# Patient Record
Sex: Female | Born: 1990 | Race: White | Hispanic: No | Marital: Married | Smoking: Former smoker
Health system: Southern US, Community
[De-identification: ages and names within clinical notes are randomized; demographics above are authoritative.]

## PROBLEM LIST (undated history)

## (undated) ENCOUNTER — Inpatient Hospital Stay: Payer: Self-pay

## (undated) DIAGNOSIS — M549 Dorsalgia, unspecified: Secondary | ICD-10-CM

## (undated) DIAGNOSIS — R079 Chest pain, unspecified: Secondary | ICD-10-CM

## (undated) DIAGNOSIS — F32A Depression, unspecified: Secondary | ICD-10-CM

## (undated) DIAGNOSIS — F419 Anxiety disorder, unspecified: Secondary | ICD-10-CM

## (undated) DIAGNOSIS — F329 Major depressive disorder, single episode, unspecified: Secondary | ICD-10-CM

## (undated) DIAGNOSIS — A64 Unspecified sexually transmitted disease: Secondary | ICD-10-CM

## (undated) DIAGNOSIS — Z9289 Personal history of other medical treatment: Secondary | ICD-10-CM

## (undated) DIAGNOSIS — K5900 Constipation, unspecified: Secondary | ICD-10-CM

## (undated) DIAGNOSIS — R0602 Shortness of breath: Secondary | ICD-10-CM

## (undated) HISTORY — DX: Dorsalgia, unspecified: M54.9

## (undated) HISTORY — DX: Depression, unspecified: F32.A

## (undated) HISTORY — DX: Major depressive disorder, single episode, unspecified: F32.9

## (undated) HISTORY — DX: Anxiety disorder, unspecified: F41.9

## (undated) HISTORY — DX: Unspecified sexually transmitted disease: A64

## (undated) HISTORY — DX: Personal history of other medical treatment: Z92.89

## (undated) HISTORY — DX: Constipation, unspecified: K59.00

## (undated) HISTORY — DX: Chest pain, unspecified: R07.9

## (undated) HISTORY — DX: Shortness of breath: R06.02

---
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
## 2013-11-05 HISTORY — PX: OTHER SURGICAL HISTORY: SHX169

## 2013-12-03 ENCOUNTER — Emergency Department: Payer: Self-pay | Admitting: Emergency Medicine

## 2013-12-03 LAB — CBC WITH DIFFERENTIAL/PLATELET
BASOS ABS: 0 10*3/uL (ref 0.0–0.1)
Basophil %: 0.2 %
Eosinophil #: 0.1 10*3/uL (ref 0.0–0.7)
Eosinophil %: 0.9 %
HCT: 44.5 % (ref 35.0–47.0)
HGB: 15 g/dL (ref 12.0–16.0)
Lymphocyte #: 0.7 10*3/uL — ABNORMAL LOW (ref 1.0–3.6)
Lymphocyte %: 7.8 %
MCH: 31.3 pg (ref 26.0–34.0)
MCHC: 33.8 g/dL (ref 32.0–36.0)
MCV: 93 fL (ref 80–100)
MONO ABS: 0.4 x10 3/mm (ref 0.2–0.9)
MONOS PCT: 4 %
Neutrophil #: 8 10*3/uL — ABNORMAL HIGH (ref 1.4–6.5)
Neutrophil %: 87.1 %
PLATELETS: 183 10*3/uL (ref 150–440)
RBC: 4.81 10*6/uL (ref 3.80–5.20)
RDW: 12.2 % (ref 11.5–14.5)
WBC: 9.2 10*3/uL (ref 3.6–11.0)

## 2013-12-03 LAB — COMPREHENSIVE METABOLIC PANEL
ALBUMIN: 3.9 g/dL (ref 3.4–5.0)
ALT: 22 U/L (ref 12–78)
Alkaline Phosphatase: 63 U/L
Anion Gap: 8 (ref 7–16)
BUN: 17 mg/dL (ref 7–18)
Bilirubin,Total: 1.1 mg/dL — ABNORMAL HIGH (ref 0.2–1.0)
CHLORIDE: 112 mmol/L — AB (ref 98–107)
Calcium, Total: 8.9 mg/dL (ref 8.5–10.1)
Co2: 20 mmol/L — ABNORMAL LOW (ref 21–32)
Creatinine: 0.74 mg/dL (ref 0.60–1.30)
EGFR (African American): >60
EGFR (Non-African Amer.): >60
GLUCOSE: 93 mg/dL (ref 65–99)
OSMOLALITY: 281 (ref 275–301)
Potassium: 3.9 mmol/L (ref 3.5–5.1)
SGOT(AST): 11 U/L — ABNORMAL LOW (ref 15–37)
Sodium: 140 mmol/L (ref 136–145)
Total Protein: 7.8 g/dL (ref 6.4–8.2)

## 2013-12-03 LAB — LIPASE, BLOOD: LIPASE: 117 U/L (ref 73–393)

## 2014-06-06 ENCOUNTER — Ambulatory Visit: Payer: Self-pay

## 2014-06-06 LAB — RAPID STREP-A WITH REFLX: Micro Text Report: NEGATIVE

## 2014-06-09 LAB — BETA STREP CULTURE(ARMC)

## 2016-02-06 LAB — HM PAP SMEAR: HM PAP: NEGATIVE

## 2016-12-24 ENCOUNTER — Encounter: Payer: Self-pay | Admitting: Obstetrics and Gynecology

## 2016-12-24 ENCOUNTER — Other Ambulatory Visit: Payer: Self-pay | Admitting: Obstetrics and Gynecology

## 2016-12-24 DIAGNOSIS — Z111 Encounter for screening for respiratory tuberculosis: Secondary | ICD-10-CM

## 2016-12-24 NOTE — Progress Notes (Signed)
Order placed. RN to notify pt to RTO for lab draw.

## 2016-12-24 NOTE — Progress Notes (Signed)
Tried to call pt. Busy signal.

## 2016-12-24 NOTE — Telephone Encounter (Signed)
Pt is calling today needing to get her TB labs draw [facility] Before April 2. Please call Pt. CB# [phone]

## 2017-01-04 NOTE — Telephone Encounter (Signed)
TB test order already in system. See 12/24/16 order. Rita, pls notify pt. Thx.

## 2017-01-04 NOTE — Telephone Encounter (Signed)
Task forwarded to Evart Sink [facility] nurse.

## 2017-01-04 NOTE — Telephone Encounter (Signed)
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 PT has her annual schedule with Helmut Muster. But needs this blood draw this week.PT called Back in March to Have this taken care. Pt is aware Helmut Muster is not in Office this week. Please advise cb# [phone]

## 2017-01-05 NOTE — Telephone Encounter (Signed)
This encounter was created in error - please disregard.

## 2017-01-26 ENCOUNTER — Other Ambulatory Visit: Payer: Self-pay | Admitting: Obstetrics and Gynecology

## 2017-01-26 ENCOUNTER — Telehealth: Payer: Self-pay

## 2017-01-26 DIAGNOSIS — F329 Major depressive disorder, single episode, unspecified: Secondary | ICD-10-CM

## 2017-01-26 DIAGNOSIS — F419 Anxiety disorder, unspecified: Principal | ICD-10-CM

## 2017-01-26 MED ORDER — LORAZEPAM 0.5 MG PO TABS: 0.5000 mg | ORAL_TABLET | Freq: Two times a day (BID) | ORAL | 0 refills | Status: DC

## 2017-01-26 NOTE — Progress Notes (Signed)
t

## 2017-01-26 NOTE — Telephone Encounter (Signed)
Pt calling - needs refill on rx.  She has called the pharm but has not heard from them.  Called pt - needs refill on ativan.  She is completely out.  She has appt c your 5/8.

## 2017-01-26 NOTE — Telephone Encounter (Signed)
Left msg for pt that rx sent to [facility] [address].

## 2017-01-26 NOTE — Telephone Encounter (Signed)
RN to fax Rx and notify pt.

## 2017-02-09 ENCOUNTER — Ambulatory Visit: Payer: Self-pay | Admitting: Obstetrics and Gynecology

## 2017-02-17 ENCOUNTER — Telehealth: Payer: Self-pay

## 2017-02-17 NOTE — Telephone Encounter (Signed)
Pt is schedule 04/13/17 °

## 2017-02-17 NOTE — Telephone Encounter (Signed)
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  [phone]

## 2017-03-19 ENCOUNTER — Telehealth: Payer: Self-pay

## 2017-03-19 NOTE — Telephone Encounter (Signed)
Pt c/o vaginal swelling that started yesterday.  Inserted new nuvaring but is unable to get it in correctly d/t the swelling.  Pt wanted to know if she needed to go to urgent care or wait for appt on Thurs.  Appt changed to Mon c ABC at 1:30.

## 2017-03-22 ENCOUNTER — Encounter: Payer: Self-pay | Admitting: Obstetrics and Gynecology

## 2017-03-22 ENCOUNTER — Ambulatory Visit (INDEPENDENT_AMBULATORY_CARE_PROVIDER_SITE_OTHER): Payer: Federal, State, Local not specified - PPO | Admitting: Obstetrics and Gynecology

## 2017-03-22 VITALS — BP 110/70 | HR 93 | Ht 63.0 in | Wt 195.0 lb

## 2017-03-22 DIAGNOSIS — Z3044 Encounter for surveillance of vaginal ring hormonal contraceptive device: Secondary | ICD-10-CM | POA: Diagnosis not present

## 2017-03-22 DIAGNOSIS — N898 Other specified noninflammatory disorders of vagina: Secondary | ICD-10-CM | POA: Diagnosis not present

## 2017-03-22 LAB — POCT WET PREP WITH KOH
Clue Cells Wet Prep HPF POC: NEGATIVE
KOH Prep POC: NEGATIVE
TRICHOMONAS UA: NEGATIVE
YEAST WET PREP PER HPF POC: NEGATIVE

## 2017-03-22 NOTE — Progress Notes (Signed)
Chief Complaint  Patient presents with  . Vaginal Discharge    swelling in vaginal area with slight discharge    HPI:      Barbara [name] is a 26 y.o. G1P0010 who LMP was Patient's last menstrual period was 03/06/2017 (exact date)., presents today for internal vaginal swelling and increased d/c without irritation/itch/odor. Sx for about 5 days. She uses the nuvaring. She removed it about 5 days ago due to d/c and then couldn't get it back in because the vaginal canal was too swollen. Pt is sex active without problems but hasn't been sex active in the past 5 days. She has not used any meds to treat.    Patient Active Problem List   Diagnosis Date Noted  . Anxiety and depression 01/26/2017    Family History  Problem Relation Age of Onset  . Alcoholism Father   . Breast cancer Maternal Grandmother 60  . Diabetes Maternal Grandfather   . Renal Disease Maternal Grandfather     Social History   Social History  . Marital status: Single    Spouse name: N/A  . Number of children: N/A  . Years of education: N/A   Occupational History  . Not on file.   Social History Main Topics  . Smoking status: Never Smoker  . Smokeless tobacco: Never Used  . Alcohol use No  . Drug use: No  . Sexual activity: Yes    Birth control/ protection: Other-see comments     Comment: nuva ring   Other Topics Concern  . Not on file   Social History Narrative  . No narrative on file     Current Outpatient Prescriptions:  .  LORazepam (ATIVAN) 0.5 MG tablet, Take 1 tablet (0.5 mg total) by mouth 2 (two) times daily. Prn sx, Disp: 30 tablet, Rfl: 0 .  NUVARING 0.12-0.015 MG/24HR vaginal ring, , Disp: , Rfl: 1 .  sertraline (ZOLOFT) 50 MG tablet, , Disp: , Rfl: 0  Review of Systems  Constitutional: Negative for fever.  Gastrointestinal: Negative for blood in stool, constipation, diarrhea, nausea and vomiting.  Genitourinary: Positive for vaginal discharge. Negative for dyspareunia,  dysuria, flank pain, frequency, hematuria, urgency, vaginal bleeding and vaginal pain.  Musculoskeletal: Negative for back pain.  Skin: Negative for rash.     OBJECTIVE:   Vitals:  BP 110/70   Pulse 93   Ht 5\' 3"  (1.6 m)   Wt 195 lb (88.5 kg)   LMP 03/06/2017 (Exact Date)   BMI 34.54 kg/m   Physical Exam  Constitutional: She is oriented to person, place, and time and well-developed, well-nourished, and in no distress. Vital signs are normal.  Genitourinary: Vagina normal, uterus normal, cervix normal, right adnexa normal, left adnexa normal and vulva normal. Uterus is not enlarged. Cervix exhibits no motion tenderness and no tenderness. Right adnexum displays no mass and no tenderness. Left adnexum displays no mass and no tenderness. Vulva exhibits no erythema, no exudate, no lesion, no rash and no tenderness. Vagina exhibits no lesion.  Neurological: She is oriented to person, place, and time.  Vitals reviewed.   Results: Results for orders placed or performed in visit on 03/22/17 (from the past 24 hour(s))  POCT Wet Prep with KOH     Status: Normal   Collection Time: 03/22/17  2:26 PM  Result Value Ref Range   Trichomonas, UA Negative    Clue Cells Wet Prep HPF POC neg    Epithelial Wet Prep HPF POC  Few, Moderate, Many, Too numerous to count   Yeast Wet Prep HPF POC neg    Bacteria Wet Prep HPF POC  Few   RBC Wet Prep HPF POC     WBC Wet Prep HPF POC     KOH Prep POC Negative Negative     Assessment/Plan: Vaginal discharge - Neg wet prep/exam. Reassurance. F/u prn. - Plan: POCT Wet Prep with KOH  Encounter for surveillance of vaginal ring hormonal contraceptive device - Reisert nuvaring. Condoms for 1 mo. F/u prn.     Return if symptoms worsen or fail to improve.  Adley Castello B. Emunah Texidor, PA-C 03/22/2017 2:27 PM

## 2017-03-25 ENCOUNTER — Ambulatory Visit: Payer: Self-pay | Admitting: Obstetrics and Gynecology

## 2017-04-13 ENCOUNTER — Ambulatory Visit: Payer: Self-pay | Admitting: Obstetrics and Gynecology

## 2017-04-27 ENCOUNTER — Other Ambulatory Visit: Payer: Self-pay | Admitting: Certified Nurse Midwife

## 2017-04-27 MED ORDER — NUVARING 0.12-0.015 MG/24HR VA RING: 1.0000 | VAGINAL_RING | VAGINAL | 1 refills | Status: DC

## 2017-05-06 ENCOUNTER — Encounter: Payer: Self-pay | Admitting: Obstetrics and Gynecology

## 2017-05-06 ENCOUNTER — Ambulatory Visit (INDEPENDENT_AMBULATORY_CARE_PROVIDER_SITE_OTHER): Payer: BC Managed Care – PPO | Admitting: Obstetrics and Gynecology

## 2017-05-06 VITALS — BP 110/70 | HR 90 | Ht 64.0 in | Wt 194.0 lb

## 2017-05-06 DIAGNOSIS — F329 Major depressive disorder, single episode, unspecified: Secondary | ICD-10-CM | POA: Diagnosis not present

## 2017-05-06 DIAGNOSIS — Z113 Encounter for screening for infections with a predominantly sexual mode of transmission: Secondary | ICD-10-CM

## 2017-05-06 DIAGNOSIS — F419 Anxiety disorder, unspecified: Secondary | ICD-10-CM

## 2017-05-06 DIAGNOSIS — Z124 Encounter for screening for malignant neoplasm of cervix: Secondary | ICD-10-CM | POA: Diagnosis not present

## 2017-05-06 DIAGNOSIS — Z3044 Encounter for surveillance of vaginal ring hormonal contraceptive device: Secondary | ICD-10-CM | POA: Diagnosis not present

## 2017-05-06 DIAGNOSIS — Z01419 Encounter for gynecological examination (general) (routine) without abnormal findings: Secondary | ICD-10-CM | POA: Diagnosis not present

## 2017-05-06 MED ORDER — NUVARING 0.12-0.015 MG/24HR VA RING: 1.0000 | VAGINAL_RING | VAGINAL | 12 refills | Status: DC

## 2017-05-06 MED ORDER — LORAZEPAM 0.5 MG PO TABS: 0.5000 mg | ORAL_TABLET | Freq: Two times a day (BID) | ORAL | 0 refills | Status: DC

## 2017-05-06 MED ORDER — SERTRALINE HCL 50 MG PO TABS: 50.0000 mg | ORAL_TABLET | Freq: Every day | ORAL | 12 refills | Status: DC

## 2017-05-06 NOTE — Progress Notes (Signed)
Chief Complaint  Patient presents with  . Gynecologic Exam     HPI:      Barbara [name] is a 26 y.o. G1P0010 who LMP was Patient's last menstrual period was 04/23/2017., presents today for her annual examination.  Her menses are regular every 28-30 days, lasting 3 days.  Dysmenorrhea mild, occurring first 1-2 days of flow. She does not have intermenstrual bleeding.  Sex activity: single partner, contraception - NuvaRing vaginal inserts.  Last Pap: Feb 06, 2016  Results were: no abnormalities  Hx of STDs: chlamydia  There is a FH of breast cancer in her MGM, genetic testing not indicated. There is no FH of ovarian cancer. The patient does do self-breast exams.  Tobacco use: The patient denies current or previous tobacco use. Alcohol use: none Exercise: not active  She does get adequate calcium and Vitamin D in her diet.  She takes zoloft 50 mg daily and ativan prn for anxiety and depression sx with good sx control. Pt denies any side effects. Wants to cont meds.  Past Medical History:  Diagnosis Date  . Anxiety   . Depression   . History of Papanicolaou smear of cervix 11/06/14; 02/06/16   NEG-CT POS; NEG - CT/GC/TR NEG;  . STD (sexually transmitted disease)    chlamydia    Past Surgical History:  Procedure Laterality Date  . elective abortion  11/2013   [facility] [place]    Family History  Problem Relation Age of Onset  . Alcoholism Father   . Breast cancer Maternal Grandmother 60  . Diabetes Maternal Grandfather   . Renal Disease Maternal Grandfather     Social History   Social History  . Marital status: Single    Spouse name: N/A  . Number of children: N/A  . Years of education: N/A   Occupational History  . Not on file.   Social History Main Topics  . Smoking status: Never Smoker  . Smokeless tobacco: Never Used  . Alcohol use No  . Drug use: No  . Sexual activity: Yes    Birth control/ protection: Other-see comments     Comment: nuva  ring   Other Topics Concern  . Not on file   Social History Narrative  . No narrative on file     Current Outpatient Prescriptions:  .  LORazepam (ATIVAN) 0.5 MG tablet, Take 1 tablet (0.5 mg total) by mouth 2 (two) times daily. Prn sx, Disp: 30 tablet, Rfl: 0 .  NUVARING 0.12-0.015 MG/24HR vaginal ring, Place 1 each vaginally every 28 (twenty-eight) days., Disp: 1 each, Rfl: 12 .  sertraline (ZOLOFT) 50 MG tablet, Take 1 tablet (50 mg total) by mouth daily., Disp: 30 tablet, Rfl: 12  ROS:  Review of Systems  Constitutional: Negative for fatigue, fever and unexpected weight change.  Respiratory: Negative for cough, shortness of breath and wheezing.   Cardiovascular: Negative for chest pain, palpitations and leg swelling.  Gastrointestinal: Negative for blood in stool, constipation, diarrhea, nausea and vomiting.  Endocrine: Negative for cold intolerance, heat intolerance and polyuria.  Genitourinary: Negative for dyspareunia, dysuria, flank pain, frequency, genital sores, hematuria, menstrual problem, pelvic pain, urgency, vaginal bleeding, vaginal discharge and vaginal pain.  Musculoskeletal: Negative for back pain, joint swelling and myalgias.  Skin: Negative for rash.  Neurological: Negative for dizziness, syncope, light-headedness, numbness and headaches.  Hematological: Negative for adenopathy.  Psychiatric/Behavioral: Negative for agitation, confusion, sleep disturbance and suicidal ideas. The patient is not nervous/anxious.  Objective: BP 110/70   Pulse 90   Ht 5\' 4"  (1.626 m)   Wt 194 lb (88 kg)   LMP 04/23/2017   BMI 33.30 kg/m    Physical Exam  Constitutional: She is oriented to person, place, and time. She appears well-developed and well-nourished.  Genitourinary: Vagina normal and uterus normal. There is no rash or tenderness on the right labia. There is no rash or tenderness on the left labia. No erythema or tenderness in the vagina. No vaginal discharge  found. Right adnexum does not display mass and does not display tenderness. Left adnexum does not display mass and does not display tenderness. Cervix does not exhibit motion tenderness or polyp. Uterus is not enlarged or tender.  Neck: Normal range of motion. No thyromegaly present.  Cardiovascular: Normal rate, regular rhythm and normal heart sounds.   No murmur heard. Pulmonary/Chest: Effort normal and breath sounds normal. Right breast exhibits no mass, no nipple discharge, no skin change and no tenderness. Left breast exhibits no mass, no nipple discharge, no skin change and no tenderness.  Abdominal: Soft. There is no tenderness. There is no guarding.  Musculoskeletal: Normal range of motion.  Neurological: She is alert and oriented to person, place, and time. No cranial nerve deficit.  Psychiatric: She has a normal mood and affect. Her behavior is normal.  Vitals reviewed.   Assessment/Plan: Encounter for annual routine gynecological examination  Cervical cancer screening - Plan: IGP,CtNgTv,rfx Aptima HPV ASCU  Screening for STD (sexually transmitted disease) - Plan: IGP,CtNgTv,rfx Aptima HPV ASCU  Encounter for surveillance of vaginal ring hormonal contraceptive device - Nuvaring RF. - Plan: NUVARING 0.12-0.015 MG/24HR vaginal ring  Anxiety and depression - Controlled with zoloft. pt takes ativan sparingly prn. Rx RF. F/u prn. - Plan: sertraline (ZOLOFT) 50 MG tablet, LORazepam (ATIVAN) 0.5 MG tablet  Meds ordered this encounter  Medications  . NUVARING 0.12-0.015 MG/24HR vaginal ring    Sig: Place 1 each vaginally every 28 (twenty-eight) days.    Dispense:  1 each    Refill:  12  . sertraline (ZOLOFT) 50 MG tablet    Sig: Take 1 tablet (50 mg total) by mouth daily.    Dispense:  30 tablet    Refill:  12  . LORazepam (ATIVAN) 0.5 MG tablet    Sig: Take 1 tablet (0.5 mg total) by mouth 2 (two) times daily. Prn sx    Dispense:  30 tablet    Refill:  0              GYN  counsel adequate intake of calcium and vitamin D     F/U  Return in about 1 year (around 05/06/2018).  Alicia B. Copland, PA-C 05/06/2017 10:11 AM

## 2017-05-08 LAB — IGP,CTNGTV,RFX APTIMA HPV ASCU
Chlamydia, Nuc. Acid Amp: NEGATIVE
Gonococcus, Nuc. Acid Amp: NEGATIVE
PAP Smear Comment: 0
Trich vag by NAA: NEGATIVE

## 2017-08-24 ENCOUNTER — Ambulatory Visit: Payer: BC Managed Care – PPO | Admitting: Obstetrics and Gynecology

## 2017-09-23 ENCOUNTER — Ambulatory Visit: Payer: BC Managed Care – PPO | Admitting: Obstetrics and Gynecology

## 2017-10-05 NOTE — L&D Delivery Note (Signed)
Delivery Summary for Barbara [name]  Labor Events:   Preterm labor:   Rupture date: 08/01/2018  Rupture time: 7:41 AM  Rupture type: Artificial Intact Possible ROM - for evaluation  Fluid Color:   Induction:   Augmentation:   Complications:   Cervical ripening:          Delivery:   Episiotomy:   Lacerations:   Repair suture:   Repair # of packets:   Blood loss (ml): 500   Information for the patient's newborn:  Zonya, Gudger [[id number]]    Delivery 08/01/2018 5:38 PM by  Vaginal, Spontaneous Sex:  female Gestational Age: [redacted]w[redacted]d Delivery Clinician:   Living?:         APGARS  One minute Five minutes Ten minutes  Skin color:        Heart rate:        Grimace:        Muscle tone:        Breathing:        Totals: 7  9      Presentation/position:      Resuscitation:   Cord information:    Disposition of cord blood:     Blood gases sent?  Complications:   Placenta: Delivered:       appearance Newborn Measurements: Weight: 8 lb 6 oz (3800 g)  Height: 20.47"  Head circumference:    Chest circumference:    Other providers:    Additional  information: Forceps:   Vacuum:   Breech:   Observed anomalies        Delivery Note At 5:38 PM a viable and healthy female was delivered via Vaginal, Spontaneous (Presentation: Vertex; ROA position).  APGAR: 7, 9; weight 3800 grams.  Heavy meconium stained fluid after delivery present. Placenta status: spontaneously removed, intact.  Cord: 3-vessel with the following complications: None.  Cord pH: not obtained.  Delayed cord clamping observed for 1 minute.  Anesthesia: Epidural  Episiotomy: None Lacerations: 2nd degree perineal Suture Repair: 2.0 vicryl Est. Blood Loss (mL): 500  Mom to postpartum.  Baby to Couplet care / Skin to Skin.  Hildred Laser 08/01/2018, 7:23 PM

## 2017-10-11 ENCOUNTER — Other Ambulatory Visit: Payer: Self-pay | Admitting: Obstetrics and Gynecology

## 2017-10-11 DIAGNOSIS — F419 Anxiety disorder, unspecified: Principal | ICD-10-CM

## 2017-10-11 DIAGNOSIS — F32A Depression, unspecified: Secondary | ICD-10-CM

## 2017-10-11 DIAGNOSIS — F329 Major depressive disorder, single episode, unspecified: Secondary | ICD-10-CM

## 2017-12-07 ENCOUNTER — Ambulatory Visit
Admission: EM | Admit: 2017-12-07 | Discharge: 2017-12-07 | Disposition: A | Discharge status: Home / Self Care | Payer: BC Managed Care – PPO | Attending: Family Medicine | Admitting: Family Medicine

## 2017-12-07 ENCOUNTER — Encounter: Payer: Self-pay | Admitting: Emergency Medicine

## 2017-12-07 ENCOUNTER — Other Ambulatory Visit: Payer: Self-pay

## 2017-12-07 DIAGNOSIS — Z3201 Encounter for pregnancy test, result positive: Secondary | ICD-10-CM | POA: Diagnosis not present

## 2017-12-07 LAB — PREGNANCY, URINE: PREG TEST UR: POSITIVE — AB

## 2017-12-07 NOTE — ED Triage Notes (Signed)
Patient in today stating she thinks she may be pregnant. Patient's LMP was the last week in January 2019. Patient has a Paediatric nurseuva Ring. Patient c/o quite a bit of nausea.

## 2017-12-07 NOTE — ED Provider Notes (Signed)
[facility] ____________________________________________  Time seen: Approximately 4:40 PM  I have reviewed the triage vital signs and the nursing notes.   HISTORY  Chief Complaint Amenorrhea   HPI Barbara [name] is a 27 y.o. female presenting with significant other at bedside for evaluation of pregnancy.  Patient reports on 11/26/2017 she removed her NuvaRing as regularly scheduled but her menstrual never started, and has not yet reinserted due to concern of pregnancy.  States last menstrual was last week in January and was described as normal.  Sexually active with same partner.  Declines concerns of STDs.  Denies current abdominal pain, back pain,dysuria, vaginal discharge, vaginal bleeding, vaginal odor or vaginal discomfort.  States has had some intermittent nausea, no vomiting.  Normal bowel movements.  States took 3 pregnancy tests yesterday and they were all positive.  Schedule appointment with OB GYN at [facility] for next week.  No recent fevers or sickness.  Reports otherwise feels well. Denies chest pain, shortness of breath, abdominal pain, dysuria, or rash. Denies recent sickness. Denies recent antibiotic use.  Hx Gravida 1, para 0, previous abortion 2015.   Past Medical History:  Diagnosis Date  . Anxiety   . Depression   . History of Papanicolaou smear of cervix 11/06/14; 02/06/16   NEG-CT POS; NEG - CT/GC/TR NEG;  . STD (sexually transmitted disease)    chlamydia    Patient Active Problem List   Diagnosis Date Noted  . Anxiety and depression 01/26/2017    Past Surgical History:  Procedure Laterality Date  . elective abortion  11/2013   [facility] [place]     No current facility-administered medications for this encounter.   Current Outpatient Medications:  .  LORazepam (ATIVAN) 0.5 MG tablet, take 1 tablet by mouth twice a day if needed for SYMPTOMS, Disp: 30 tablet, Rfl: 0 .  NUVARING 0.12-0.015 MG/24HR vaginal ring, Place 1 each vaginally  every 28 (twenty-eight) days., Disp: 1 each, Rfl: 12 .  sertraline (ZOLOFT) 50 MG tablet, Take 1 tablet (50 mg total) by mouth daily., Disp: 30 tablet, Rfl: 12  Allergies Patient has no known allergies.  Family History  Problem Relation Age of Onset  . Alcoholism Father   . Breast cancer Maternal Grandmother 60  . Diabetes Maternal Grandfather   . Renal Disease Maternal Grandfather     Social History Social History   Tobacco Use  . Smoking status: Never Smoker  . Smokeless tobacco: Never Used  Substance Use Topics  . Alcohol use: No  . Drug use: No    Review of Systems Constitutional: No fever/chills Cardiovascular: Denies chest pain. Respiratory: Denies shortness of breath. Gastrointestinal: No abdominal pain.  As above.  Genitourinary: Negative for dysuria. Musculoskeletal: Negative for back pain. Skin: Negative for rash.   ____________________________________________   PHYSICAL EXAM:  VITAL SIGNS: ED Triage Vitals [12/07/17 1544]  Enc Vitals Group     BP (!) 154/90     Pulse Rate 98     Resp 16     Temp 97.6 F (36.4 C)     Temp Source Oral     SpO2 100 %     Weight 200 lb (90.7 kg)     Height 5\' 6"  (1.676 m)     Head Circumference      Peak Flow      Pain Score 0     Pain Loc      Pain Edu?      Excl. in GC?  Constitutional: Alert and oriented. Well appearing and in no acute distress. Cardiovascular: Normal rate, regular rhythm. Grossly normal heart sounds.  Good peripheral circulation. Respiratory: Normal respiratory effort without tachypnea nor retractions. Breath sounds are clear and equal bilaterally. No wheezes, rales, rhonchi. Gastrointestinal: Soft and nontender. Normal Bowel sounds. No CVA tenderness. Musculoskeletal:No midline cervical, thoracic or lumbar tenderness to palpation.  Neurologic:  Normal speech and language.Speech is normal. No gait instability.  Skin:  Skin is warm, dry and intact. No rash noted. Psychiatric: Mood and  affect are normal. Speech and behavior are normal. Patient exhibits appropriate insight and judgment   ___________________________________________   LABS (all labs ordered are listed, but only abnormal results are displayed)  Labs Reviewed  PREGNANCY, URINE - Abnormal; Notable for the following components:      Result Value   Preg Test, Ur POSITIVE (*)    All other components within normal limits   PROCEDURES Procedures   INITIAL IMPRESSION / ASSESSMENT AND PLAN / ED COURSE  Pertinent labs & imaging results that were available during my care of the patient were reviewed by me and considered in my medical decision making (see chart for details).  Well appearing patient.  No acute distress.  Patient urine pregnancy positive.  Suspect approximate [redacted] weeks pregnant.  Has an appointment next week with OB/GYN.  Patient declines smoking or drinking.  Recommend for starting daily prenatal vitamin.  Discuss eating small meals and supportive care for nausea.  Discussed strict follow-up and return parameters.  Discussed follow up with Primary care physician this week. Discussed follow up and return parameters including no resolution or any worsening concerns. Patient verbalized understanding and agreed to plan.   ____________________________________________   FINAL CLINICAL IMPRESSION(S) / ED DIAGNOSES  Final diagnoses:  Positive pregnancy test     ED Discharge Orders    None       Note: This dictation was prepared with Dragon dictation along with smaller phrase technology. Any transcriptional errors that result from this process are unintentional.         Renford DillsMiller, Fredric Slabach, NP 12/07/17 2045

## 2017-12-07 NOTE — ED Triage Notes (Signed)
Patient took out her Nuva Ring on 11/26/17, but period never started.

## 2017-12-07 NOTE — Discharge Instructions (Signed)
Follow up with your OBGYN as discussed.   Follow up with your primary care physician this week as needed. Return to Urgent care for new or worsening concerns.

## 2017-12-16 ENCOUNTER — Ambulatory Visit (INDEPENDENT_AMBULATORY_CARE_PROVIDER_SITE_OTHER): Payer: BC Managed Care – PPO | Admitting: Obstetrics & Gynecology

## 2017-12-16 ENCOUNTER — Encounter: Payer: Self-pay | Admitting: Obstetrics & Gynecology

## 2017-12-16 VITALS — BP 120/80 | Wt 192.0 lb

## 2017-12-16 DIAGNOSIS — F329 Major depressive disorder, single episode, unspecified: Secondary | ICD-10-CM

## 2017-12-16 DIAGNOSIS — F419 Anxiety disorder, unspecified: Secondary | ICD-10-CM

## 2017-12-16 DIAGNOSIS — Z23 Encounter for immunization: Secondary | ICD-10-CM | POA: Diagnosis not present

## 2017-12-16 DIAGNOSIS — E669 Obesity, unspecified: Secondary | ICD-10-CM

## 2017-12-16 DIAGNOSIS — N926 Irregular menstruation, unspecified: Secondary | ICD-10-CM

## 2017-12-16 DIAGNOSIS — Z3491 Encounter for supervision of normal pregnancy, unspecified, first trimester: Secondary | ICD-10-CM

## 2017-12-16 LAB — POCT URINE PREGNANCY: Preg Test, Ur: POSITIVE — AB

## 2017-12-16 NOTE — Addendum Note (Signed)
Addended by: Cornelius MorasPATTERSON, Raina Sole D on: 12/16/2017 03:44 PM   Modules accepted: Orders

## 2017-12-16 NOTE — Progress Notes (Signed)
12/16/2017   Chief Complaint: Missed period  Transfer of Care Patient: no  History of Present Illness: [name] is a 27 y.o. G2P0010 6472w6d based on Patient's last menstrual period was 10/29/2017. with an Estimated Date of Delivery: 08/05/18, with the above CC.   Her periods were: regular periods every 28 days She was using NuvaRing vaginal inserts when she conceived.  She has Positive signs or symptoms of nausea/vomiting of pregnancy. She has Negative signs or symptoms of miscarriage or preterm labor She identifies Negative Zika risk factors for her and her partner On any different medications around the time she conceived/early pregnancy: Yes - Zoloft (also was on Nuvaring for a month until she realized she was pregnant) History of varicella: Yes   ROS: A 12-point review of systems was performed and negative, except as stated in the above HPI.  OBGYN History: As per HPI. OB History  Gravida Para Term Preterm AB Living  2 0     1    SAB TAB Ectopic Multiple Live Births    1          # Outcome Date GA Lbr Len/2nd Weight Sex Delivery Anes PTL Lv  2 Current           1 TAB               Any issues with any prior pregnancies: not applicable Any prior children are healthy, doing well, without any problems or issues: not applicable History of pap smears: Yes. Last pap smear 2018. Abnormal: no  History of STIs: Yes   Past Medical History: Past Medical History:  Diagnosis Date  . Anxiety   . Depression   . History of Papanicolaou smear of cervix 11/06/14; 02/06/16   NEG-CT POS; NEG - CT/GC/TR NEG;  . STD (sexually transmitted disease)    chlamydia    Past Surgical History: Past Surgical History:  Procedure Laterality Date  . elective abortion  11/2013   [facility] [place]    Family History:  Family History  Problem Relation Age of Onset  . Alcoholism Father   . Breast cancer Maternal Grandmother 60  . Diabetes Maternal Grandfather   . Renal Disease Maternal  Grandfather    She denies any female cancers, bleeding or blood clotting disorders.  She denies any history of mental retardation, birth defects or genetic disorders in her or the FOB's history  Social History:  Social History   Socioeconomic History  . Marital status: Single    Spouse name: Not on file  . Number of children: Not on file  . Years of education: Not on file  . Highest education level: Not on file  Social Needs  . Financial resource strain: Not on file  . Food insecurity - worry: Not on file  . Food insecurity - inability: Not on file  . Transportation needs - medical: Not on file  . Transportation needs - non-medical: Not on file  Occupational History  . Not on file  Tobacco Use  . Smoking status: Never Smoker  . Smokeless tobacco: Never Used  Substance and Sexual Activity  . Alcohol use: No  . Drug use: No  . Sexual activity: Yes    Comment: nuva ring  Other Topics Concern  . Not on file  Social History Narrative  . Not on file   Any pets in the household: no  Allergy: No Known Allergies  Current Outpatient Medications:  Current Outpatient Medications:  .  LORazepam (ATIVAN)  0.5 MG tablet, take 1 tablet by mouth twice a day if needed for SYMPTOMS (Patient not taking: Reported on 12/16/2017), Disp: 30 tablet, Rfl: 0 .  NUVARING 0.12-0.015 MG/24HR vaginal ring, Place 1 each vaginally every 28 (twenty-eight) days. (Patient not taking: Reported on 12/16/2017), Disp: 1 each, Rfl: 12 .  sertraline (ZOLOFT) 50 MG tablet, Take 1 tablet (50 mg total) by mouth daily. (Patient not taking: Reported on 12/16/2017), Disp: 30 tablet, Rfl: 12   Physical Exam:   BP 120/80   Wt 192 lb (87.1 kg)   LMP 10/29/2017   BMI 30.99 kg/m  Body mass index is 30.99 kg/m. Constitutional: Well nourished, well developed female in no acute distress.  Neck:  Supple, normal appearance, and no thyromegaly  Cardiovascular: S1, S2 normal, no murmur, rub or gallop, regular rate and  rhythm Respiratory:  Clear to auscultation bilateral. Normal respiratory effort Abdomen: positive bowel sounds and no masses, hernias; diffusely non tender to palpation, non distended Breasts: breasts appear normal, no suspicious masses, no skin or nipple changes or axillary nodes. Neuro/Psych:  Normal mood and affect.  Skin:  Warm and dry.  Lymphatic:  No inguinal lymphadenopathy.   Pelvic exam: is not limited by body habitus EGBUS: within normal limits, Vagina: within normal limits and with no blood in the vault, Cervix: normal appearing cervix without discharge or lesions, closed/long/high, Uterus:  nonenlarged, and Adnexa:  no mass, fullness, tenderness  Assessment: [name] is a 27 y.o. G2P0010 [redacted]w[redacted]d based on Patient's last menstrual period was 10/29/2017. with an Estimated Date of Delivery: 08/05/18,  for prenatal care.  Plan:  1) Avoid alcoholic beverages. 2) Patient encouraged not to smoke.  3) Discontinue the use of all non-medicinal drugs and chemicals.  4) Take prenatal vitamins daily.  5) Seatbelt use advised 6) Nutrition, food safety (fish, cheese advisories, and high nitrite foods) and exercise discussed. 7) Hospital and practice style delivering at [facility] discussed  8) Patient is asked about travel to areas at risk for the Zika virus, and counseled to avoid travel and exposure to mosquitoes or sexual partners who may have themselves been exposed to the virus. Testing is discussed, and will be ordered as appropriate.  9) Childbirth classes at [facility] advised 10) Genetic Screening, such as with 1st Trimester Screening, cell free fetal DNA, AFP testing, and Ultrasound, as well as with amniocentesis and CVS as appropriate, is discussed with patient. She plans to CONSIDER genetic testing this pregnancy. 11) [place] soon, FLu shot today, Glucola 12 weeks, Cont Zoloft and counseled as to alternative tx's and no tx concerns  Problem list reviewed and updated.  Annamarie Major, MD, [facility]  Ob/Gyn, [facility] 12/16/2017  3:29 PM

## 2017-12-16 NOTE — Patient Instructions (Signed)
First Trimester of Pregnancy The first trimester of pregnancy is from week 1 until the end of week 13 (months 1 through 3). A week after a sperm fertilizes an egg, the egg will implant on the wall of the uterus. This embryo will begin to develop into a baby. Genes from you and your partner will form the baby. The female genes will determine whether the baby will be a boy or a girl. At 6-8 weeks, the eyes and face will be formed, and the heartbeat can be seen on ultrasound. At the end of 12 weeks, all the baby's organs will be formed. Now that you are pregnant, you will want to do everything you can to have a healthy baby. Two of the most important things are to get good prenatal care and to follow your health care provider's instructions. Prenatal care is all the medical care you receive before the baby's birth. This care will help prevent, find, and treat any problems during the pregnancy and childbirth. Body changes during your first trimester Your body goes through many changes during pregnancy. The changes vary from woman to woman.  You may gain or lose a couple of pounds at first.  You may feel sick to your stomach (nauseous) and you may throw up (vomit). If the vomiting is uncontrollable, call your health care provider.  You may tire easily.  You may develop headaches that can be relieved by medicines. All medicines should be approved by your health care provider.  You may urinate more often. Painful urination may mean you have a bladder infection.  You may develop heartburn as a result of your pregnancy.  You may develop constipation because certain hormones are causing the muscles that push stool through your intestines to slow down.  You may develop hemorrhoids or swollen veins (varicose veins).  Your breasts may begin to grow larger and become tender. Your nipples may stick out more, and the tissue that surrounds them (areola) may become darker.  Your gums may bleed and may be  sensitive to brushing and flossing.  Dark spots or blotches (chloasma, mask of pregnancy) may develop on your face. This will likely fade after the baby is born.  Your menstrual periods will stop.  You may have a loss of appetite.  You may develop cravings for certain kinds of food.  You may have changes in your emotions from day to day, such as being excited to be pregnant or being concerned that something may go wrong with the pregnancy and baby.  You may have more vivid and strange dreams.  You may have changes in your hair. These can include thickening of your hair, rapid growth, and changes in texture. Some women also have hair loss during or after pregnancy, or hair that feels dry or thin. Your hair will most likely return to normal after your baby is born.  What to expect at prenatal visits During a routine prenatal visit:  You will be weighed to make sure you and the baby are growing normally.  Your blood pressure will be taken.  Your abdomen will be measured to track your baby's growth.  The fetal heartbeat will be listened to between weeks 10 and 14 of your pregnancy.  Test results from any previous visits will be discussed.  Your health care provider may ask you:  How you are feeling.  If you are feeling the baby move.  If you have had any abnormal symptoms, such as leaking fluid, bleeding, severe headaches,   or abdominal cramping.  If you are using any tobacco products, including cigarettes, chewing tobacco, and electronic cigarettes.  If you have any questions.  Other tests that may be performed during your first trimester include:  Blood tests to find your blood type and to check for the presence of any previous infections. The tests will also be used to check for low iron levels (anemia) and protein on red blood cells (Rh antibodies). Depending on your risk factors, or if you previously had diabetes during pregnancy, you may have tests to check for high blood  sugar that affects pregnant women (gestational diabetes).  Urine tests to check for infections, diabetes, or protein in the urine.  An ultrasound to confirm the proper growth and development of the baby.  Fetal screens for spinal cord problems (spina bifida) and Down syndrome.  HIV (human immunodeficiency virus) testing. Routine prenatal testing includes screening for HIV, unless you choose not to have this test.  You may need other tests to make sure you and the baby are doing well.  Follow these instructions at home: Medicines  Follow your health care provider's instructions regarding medicine use. Specific medicines may be either safe or unsafe to take during pregnancy.  Take a prenatal vitamin that contains at least 600 micrograms (mcg) of folic acid.  If you develop constipation, try taking a stool softener if your health care provider approves. Eating and drinking  Eat a balanced diet that includes fresh fruits and vegetables, whole grains, good sources of protein such as meat, eggs, or tofu, and low-fat dairy. Your health care provider will help you determine the amount of weight gain that is right for you.  Avoid raw meat and uncooked cheese. These carry germs that can cause birth defects in the baby.  Eating four or five small meals rather than three large meals a day may help relieve nausea and vomiting. If you start to feel nauseous, eating a few soda crackers can be helpful. Drinking liquids between meals, instead of during meals, also seems to help ease nausea and vomiting.  Limit foods that are high in fat and processed sugars, such as fried and sweet foods.  To prevent constipation: ? Eat foods that are high in fiber, such as fresh fruits and vegetables, whole grains, and beans. ? Drink enough fluid to keep your urine clear or pale yellow. Activity  Exercise only as directed by your health care provider. Most women can continue their usual exercise routine during  pregnancy. Try to exercise for 30 minutes at least 5 days a week. Exercising will help you: ? Control your weight. ? Stay in shape. ? Be prepared for labor and delivery.  Experiencing pain or cramping in the lower abdomen or lower back is a good sign that you should stop exercising. Check with your health care provider before continuing with normal exercises.  Try to avoid standing for long periods of time. Move your legs often if you must stand in one place for a long time.  Avoid heavy lifting.  Wear low-heeled shoes and practice good posture.  You may continue to have sex unless your health care provider tells you not to. Relieving pain and discomfort  Wear a good support bra to relieve breast tenderness.  Take warm sitz baths to soothe any pain or discomfort caused by hemorrhoids. Use hemorrhoid cream if your health care provider approves.  Rest with your legs elevated if you have leg cramps or low back pain.  If you develop   varicose veins in your legs, wear support hose. Elevate your feet for 15 minutes, 3-4 times a day. Limit salt in your diet. Prenatal care  Schedule your prenatal visits by the twelfth week of pregnancy. They are usually scheduled monthly at first, then more often in the last 2 months before delivery.  Write down your questions. Take them to your prenatal visits.  Keep all your prenatal visits as told by your health care provider. This is important. Safety  Wear your seat belt at all times when driving.  Make a list of emergency phone numbers, including numbers for family, friends, the hospital, and police and fire departments. General instructions  Ask your health care provider for a referral to a local prenatal education class. Begin classes no later than the beginning of month 6 of your pregnancy.  Ask for help if you have counseling or nutritional needs during pregnancy. Your health care provider can offer advice or refer you to specialists for help  with various needs.  Do not use hot tubs, steam rooms, or saunas.  Do not douche or use tampons or scented sanitary pads.  Do not cross your legs for long periods of time.  Avoid cat litter boxes and soil used by cats. These carry germs that can cause birth defects in the baby and possibly loss of the fetus by miscarriage or stillbirth.  Avoid all smoking, herbs, alcohol, and medicines not prescribed by your health care provider. Chemicals in these products affect the formation and growth of the baby.  Do not use any products that contain nicotine or tobacco, such as cigarettes and e-cigarettes. If you need help quitting, ask your health care provider. You may receive counseling support and other resources to help you quit.  Schedule a dentist appointment. At home, brush your teeth with a soft toothbrush and be gentle when you floss. Contact a health care provider if:  You have dizziness.  You have mild pelvic cramps, pelvic pressure, or nagging pain in the abdominal area.  You have persistent nausea, vomiting, or diarrhea.  You have a bad smelling vaginal discharge.  You have pain when you urinate.  You notice increased swelling in your face, hands, legs, or ankles.  You are exposed to fifth disease or chickenpox.  You are exposed to German measles (rubella) and have never had it. Get help right away if:  You have a fever.  You are leaking fluid from your vagina.  You have spotting or bleeding from your vagina.  You have severe abdominal cramping or pain.  You have rapid weight gain or loss.  You vomit blood or material that looks like coffee grounds.  You develop a severe headache.  You have shortness of breath.  You have any kind of trauma, such as from a fall or a car accident. Summary  The first trimester of pregnancy is from week 1 until the end of week 13 (months 1 through 3).  Your body goes through many changes during pregnancy. The changes vary from  woman to woman.  You will have routine prenatal visits. During those visits, your health care provider will examine you, discuss any test results you may have, and talk with you about how you are feeling. This information is not intended to replace advice given to you by your health care provider. Make sure you discuss any questions you have with your health care provider. Document Released: 09/15/2001 Document Revised: 09/02/2016 Document Reviewed: 09/02/2016 Elsevier Interactive Patient Education  2018 Elsevier   Inc.  

## 2017-12-17 LAB — RPR+RH+ABO+RUB AB+AB SCR+CB...
ANTIBODY SCREEN: NEGATIVE
HEMATOCRIT: 41.2 % (ref 34.0–46.6)
HEMOGLOBIN: 13.6 g/dL (ref 11.1–15.9)
HIV SCREEN 4TH GENERATION: NONREACTIVE
Hepatitis B Surface Ag: NEGATIVE
MCH: 30.5 pg (ref 26.6–33.0)
MCHC: 33 g/dL (ref 31.5–35.7)
MCV: 92 fL (ref 79–97)
Platelets: 198 10*3/uL (ref 150–379)
RBC: 4.46 x10E6/uL (ref 3.77–5.28)
RDW: 13.1 % (ref 12.3–15.4)
RH TYPE: POSITIVE
RPR: NONREACTIVE
RUBELLA: 2.62 {index} (ref >0.99)
Varicella zoster IgG: 3264 index (ref >165)
WBC: 8.5 10*3/uL (ref 3.4–10.8)

## 2017-12-18 LAB — GC/CHLAMYDIA PROBE AMP
CHLAMYDIA, DNA PROBE: NEGATIVE
Neisseria gonorrhoeae by PCR: NEGATIVE

## 2017-12-18 LAB — URINE CULTURE

## 2017-12-21 ENCOUNTER — Ambulatory Visit (INDEPENDENT_AMBULATORY_CARE_PROVIDER_SITE_OTHER): Payer: BC Managed Care – PPO

## 2017-12-21 ENCOUNTER — Encounter: Payer: Self-pay | Admitting: Obstetrics and Gynecology

## 2017-12-21 ENCOUNTER — Ambulatory Visit (INDEPENDENT_AMBULATORY_CARE_PROVIDER_SITE_OTHER): Payer: BC Managed Care – PPO | Admitting: Obstetrics and Gynecology

## 2017-12-21 VITALS — BP 130/76 | Wt 191.0 lb

## 2017-12-21 DIAGNOSIS — N926 Irregular menstruation, unspecified: Secondary | ICD-10-CM | POA: Diagnosis not present

## 2017-12-21 DIAGNOSIS — Z3A01 Less than 8 weeks gestation of pregnancy: Secondary | ICD-10-CM

## 2017-12-21 DIAGNOSIS — Z3490 Encounter for supervision of normal pregnancy, unspecified, unspecified trimester: Secondary | ICD-10-CM | POA: Insufficient documentation

## 2017-12-21 DIAGNOSIS — Z3481 Encounter for supervision of other normal pregnancy, first trimester: Secondary | ICD-10-CM

## 2017-12-21 NOTE — Progress Notes (Signed)
    Routine Prenatal Care Visit  Subjective  Barbara [name] is a 27 y.o. G2P0010 at 363w6d being seen today for ongoing prenatal care.  She is currently monitored for the following issues for this low-risk pregnancy and has Anxiety and depression and Supervision of normal pregnancy on their problem list.  ----------------------------------------------------------------------------------- Patient reports no complaints.    . Vag. Bleeding: None.   . Denies leaking of fluid.  ----------------------------------------------------------------------------------- The following portions of the patient's history were reviewed and updated as appropriate: allergies, current medications, past family history, past medical history, past social history, past surgical history and problem list. Problem list updated.   Objective  Blood pressure 130/76, weight 191 lb (86.6 kg), last menstrual period 10/29/2017. Pregravid weight 192 lb (87.1 kg) Total Weight Gain  (-0.454 kg) Urinalysis:      Fetal Status: Fetal Heart Rate (bpm): 158         General:  Alert, oriented and cooperative. Patient is in no acute distress.  Skin: Skin is warm and dry. No rash noted.   Cardiovascular: Normal heart rate noted  Respiratory: Normal respiratory effort, no problems with respiration noted  Abdomen: Soft, gravid, appropriate for gestational age. Pain/Pressure: Absent     Pelvic:  Cervical exam deferred        Extremities: Normal range of motion.     ental Status: Normal mood and affect. Normal behavior. Normal judgment and thought content.     Assessment   27 y.o. G2P0010 at 383w6d by  08/03/2018, by Ultrasound presenting for routine prenatal visit  Plan   pregnancy 2 Problems (from 10/29/17 to present)    Problem Noted Resolved   Supervision of normal pregnancy 12/21/2017 by Natale MilchSchuman, Elliott Lasecki R, MD No   Overview Addendum 12/21/2017 11:50 AM by Natale MilchSchuman, Serai Tukes R, MD      Clinic [facility] Prenatal Labs  Dating   7wk US Blood type: O/Positive/-- (03/14 1547)   Genetic Screen 1 Screen:     AFP:      Quad:      NIPS:    Antibody:Negative (03/14 1547)  Anatomic US  Rubella: 2.62 (03/14 1547) Varicella: Immune  GTT Early:        28 wk:      RPR: Non Reactive (03/14 1547)   Rhogam  HBsAg: Negative (03/14 1547)   TDaP vaccine                       HIV: Non Reactive (03/14 1547)   Flu Shot   12/16/17                             GBS:   Contraception  IUD Pap: NIL 05/2017  CBB     CS/VBAC    Baby Food    Support Person                 Gestational age appropriate obstetric precautions including but not limited to vaginal bleeding, contractions, leaking of fluid and fetal movement were reviewed in detail with the patient.    Discussed first trimester screening vs Materniti 21 testing. Patient desires genetic testing with Materniti 21. She will return in about 2 weeks for that test Given information on prenatal classes Given information on birth control, patient interested in IUD Return in about 2 weeks (around 01/05/2018) for ROB.  Barbara Idlerhristanna Nieve Rojero MD [facility], [facility] 12/21/2017, 12:09 PM

## 2018-01-05 ENCOUNTER — Ambulatory Visit (INDEPENDENT_AMBULATORY_CARE_PROVIDER_SITE_OTHER): Payer: BC Managed Care – PPO | Admitting: Obstetrics and Gynecology

## 2018-01-05 ENCOUNTER — Encounter: Payer: Self-pay | Admitting: Obstetrics and Gynecology

## 2018-01-05 VITALS — BP 120/70 | Wt 193.0 lb

## 2018-01-05 DIAGNOSIS — O99211 Obesity complicating pregnancy, first trimester: Secondary | ICD-10-CM

## 2018-01-05 DIAGNOSIS — Z3481 Encounter for supervision of other normal pregnancy, first trimester: Secondary | ICD-10-CM

## 2018-01-05 DIAGNOSIS — F32A Depression, unspecified: Secondary | ICD-10-CM

## 2018-01-05 DIAGNOSIS — F329 Major depressive disorder, single episode, unspecified: Secondary | ICD-10-CM

## 2018-01-05 DIAGNOSIS — Z1379 Encounter for other screening for genetic and chromosomal anomalies: Secondary | ICD-10-CM

## 2018-01-05 DIAGNOSIS — Z3A10 10 weeks gestation of pregnancy: Secondary | ICD-10-CM

## 2018-01-05 DIAGNOSIS — F419 Anxiety disorder, unspecified: Secondary | ICD-10-CM

## 2018-01-07 ENCOUNTER — Telehealth: Payer: Self-pay

## 2018-01-07 NOTE — Telephone Encounter (Signed)
Okay, will make note in chart. She will have to avoid looking in mychart.  Test not back yet.

## 2018-01-07 NOTE — Telephone Encounter (Signed)
Pt called triage and would like Dr. Jerene PitchSchuman to call her with results only to tell her if everything is okay with the baby. SHE DOES NOT WANT TO KNOW GENDER. Pt sister would like to pick up envelope with gender in it.

## 2018-01-10 LAB — MATERNIT 21 PLUS CORE, BLOOD
Chromosome 13: NEGATIVE
Chromosome 18: NEGATIVE
Chromosome 21: NEGATIVE
Y Chromosome: NOT DETECTED

## 2018-01-10 NOTE — Telephone Encounter (Signed)
Pt aware not to look at [facility]

## 2018-01-13 ENCOUNTER — Telehealth: Payer: Self-pay

## 2018-01-13 DIAGNOSIS — Z131 Encounter for screening for diabetes mellitus: Secondary | ICD-10-CM

## 2018-01-13 NOTE — Telephone Encounter (Signed)
Pt calling for genetic testing results status/timeline.  [phone]

## 2018-01-13 NOTE — Telephone Encounter (Signed)
Patient is calling to follow up on lab results. Please advise. Patient reports [facility] states they have already sent the rest on 01/09/18

## 2018-01-14 ENCOUNTER — Other Ambulatory Visit: Payer: Self-pay | Admitting: Obstetrics & Gynecology

## 2018-01-14 NOTE — Telephone Encounter (Signed)
I have (normal) and I tried to call patient but there was no answer (left message to call back).

## 2018-01-14 NOTE — Telephone Encounter (Signed)
Please advise if you have reviewed the results

## 2018-01-24 ENCOUNTER — Encounter: Payer: BC Managed Care – PPO | Admitting: Obstetrics and Gynecology

## 2018-01-24 ENCOUNTER — Other Ambulatory Visit: Payer: BC Managed Care – PPO

## 2018-01-25 ENCOUNTER — Encounter: Payer: Self-pay | Admitting: Obstetrics and Gynecology

## 2018-01-25 ENCOUNTER — Ambulatory Visit (INDEPENDENT_AMBULATORY_CARE_PROVIDER_SITE_OTHER): Payer: Self-pay | Admitting: Obstetrics and Gynecology

## 2018-01-25 VITALS — BP 110/76 | Wt 194.9 lb

## 2018-01-25 DIAGNOSIS — Z3481 Encounter for supervision of other normal pregnancy, first trimester: Secondary | ICD-10-CM

## 2018-01-25 LAB — POCT URINALYSIS DIPSTICK
Bilirubin, UA: NEGATIVE
Blood, UA: NEGATIVE
Glucose, UA: NEGATIVE
KETONES UA: NEGATIVE
Leukocytes, UA: NEGATIVE
NITRITE UA: NEGATIVE
ODOR: NEGATIVE
PH UA: 6.5 (ref 5.0–8.0)
Protein, UA: NEGATIVE
SPEC GRAV UA: 1.02 (ref 1.010–1.025)
UROBILINOGEN UA: 0.2 U/dL

## 2018-01-25 NOTE — Progress Notes (Signed)
[facility]

## 2018-01-25 NOTE — Progress Notes (Signed)
ROB: Patient doing well.  [facility]  Normal [facility] testing.  Desires AFP at next visit for spina bifida.  Taking vitamins as directed.  Numerous questions answered regarding over-the-counter medications, round ligament pain, use of clobetasol on a vulvar lesion etc.

## 2018-02-17 ENCOUNTER — Ambulatory Visit (INDEPENDENT_AMBULATORY_CARE_PROVIDER_SITE_OTHER): Payer: BC Managed Care – PPO | Admitting: Obstetrics and Gynecology

## 2018-02-17 ENCOUNTER — Encounter: Payer: Self-pay | Admitting: Obstetrics and Gynecology

## 2018-02-17 VITALS — BP 118/83 | HR 89 | Wt 199.2 lb

## 2018-02-17 DIAGNOSIS — F329 Major depressive disorder, single episode, unspecified: Secondary | ICD-10-CM

## 2018-02-17 DIAGNOSIS — F419 Anxiety disorder, unspecified: Secondary | ICD-10-CM

## 2018-02-17 MED ORDER — SERTRALINE HCL 50 MG PO TABS: 50.0000 mg | ORAL_TABLET | Freq: Every day | ORAL | 1 refills | Status: DC

## 2018-02-17 NOTE — Progress Notes (Signed)
HPI:      Ms. BRIELLA HOBDAY is a 27 y.o. G2P0010 who LMP was Patient's last menstrual period was 10/29/2017 (approximate).  Subjective:   She presents today specifically because she is having problems with mood changes.  She is tearful and is feeling overwhelmed.  She states this is exactly how she felt before when she took Zoloft.  She stopped Zoloft during the pregnancy.  She would like to restart it because she does not think she is functioning well without it.    Hx: The following portions of the patient's history were reviewed and updated as appropriate:             She  has a past medical history of Anxiety, Depression, History of Papanicolaou smear of cervix (11/06/14; 02/06/16), and STD (sexually transmitted disease). She does not have any pertinent problems on file. She  has a past surgical history that includes elective abortion (11/2013). Her family history includes Alcoholism in her father; Breast cancer (age of onset: 69) in her maternal grandmother; Diabetes in her maternal grandfather; Renal Disease in her maternal grandfather. She  reports that she has never smoked. She has never used smokeless tobacco. She reports that she does not drink alcohol or use drugs. She has a current medication list which includes the following prescription(s): docusate sodium, prenatal multivitamin, ranitidine, simethicone, and sertraline. She has No Known Allergies.       Review of Systems:  Review of Systems  Constitutional: Denied constitutional symptoms, night sweats, recent illness, fatigue, fever, insomnia and weight loss.  Eyes: Denied eye symptoms, eye pain, photophobia, vision change and visual disturbance.  Ears/Nose/Throat/Neck: Denied ear, nose, throat or neck symptoms, hearing loss, nasal discharge, sinus congestion and sore throat.  Cardiovascular: Denied cardiovascular symptoms, arrhythmia, chest pain/pressure, edema, exercise intolerance, orthopnea and palpitations.  Respiratory:  Denied pulmonary symptoms, asthma, pleuritic pain, productive sputum, cough, dyspnea and wheezing.  Gastrointestinal: Denied, gastro-esophageal reflux, melena, nausea and vomiting.  Genitourinary: Denied genitourinary symptoms including symptomatic vaginal discharge, pelvic relaxation issues, and urinary complaints.  Musculoskeletal: Denied musculoskeletal symptoms, stiffness, swelling, muscle weakness and myalgia.  Dermatologic: Denied dermatology symptoms, rash and scar.  Neurologic: Denied neurology symptoms, dizziness, headache, neck pain and syncope.  Psychiatric: See HPI for additional information.  Endocrine: Denied endocrine symptoms including hot flashes and night sweats.   Meds:   Current Outpatient Medications on File Prior to Visit  Medication Sig Dispense Refill  . docusate sodium (COLACE) 100 MG capsule Take 100 mg by mouth 2 (two) times daily.    . Prenatal Vit-Fe Fumarate-FA (PRENATAL MULTIVITAMIN) TABS tablet Take 1 tablet by mouth daily at 12 noon.    . ranitidine (ZANTAC) 150 MG capsule Take 150 mg by mouth 2 (two) times daily.    . simethicone (MYLICON) 125 MG chewable tablet Chew 125 mg by mouth every 6 (six) hours as needed for flatulence.     No current facility-administered medications on file prior to visit.     Objective:     Vitals:   02/17/18 1344  BP: 118/83  Pulse: 89                Assessment:    G2P0010 Patient Active Problem List   Diagnosis Date Noted  . Supervision of normal pregnancy 12/21/2017  . Anxiety and depression 01/26/2017     1. Anxiety and depression     Patient is having problems with depression and feeling overwhelmed.   Plan:  1.  We discussed multiple  strategies for management of anxiety  and depression.  Setting appropriate boundaries was discussed.  2.  Restart Zoloft 50 mg daily. Orders No orders of the defined types were placed in this encounter.    Meds ordered this encounter  Medications  .  sertraline (ZOLOFT) 50 MG tablet    Sig: Take 1 tablet (50 mg total) by mouth daily.    Dispense:  30 tablet    Refill:  1      F/U  Return in about 1 week (around 02/24/2018). I spent 18 minutes involved in the care of this patient of which greater than 50% was spent discussing her current and previous depression.  Her previous use of Zoloft.  The specific issues that are causing her stress including her mother.  Elonda Husky, M.D. 02/17/2018 2:16 PM

## 2018-02-17 NOTE — Progress Notes (Deleted)
    Hx: The following portions of the patient's history were reviewed and updated as appropriate:             {Past history links:20407}       Review of Systems:  Review of Systems  Constitutional: Denied constitutional symptoms, night sweats, recent illness, fatigue, fever, insomnia and weight loss.  Eyes: Denied eye symptoms, eye pain, photophobia, vision change and visual disturbance.  Ears/Nose/Throat/Neck: Denied ear, nose, throat or neck symptoms, hearing loss, nasal discharge, sinus congestion and sore throat.  Cardiovascular: Denied cardiovascular symptoms, arrhythmia, chest pain/pressure, edema, exercise intolerance, orthopnea and palpitations.  Respiratory: Denied pulmonary symptoms, asthma, pleuritic pain, productive sputum, cough, dyspnea and wheezing.  Gastrointestinal: Denied, gastro-esophageal reflux, melena, nausea and vomiting.  Genitourinary:*** Denied genitourinary symptoms including symptomatic vaginal discharge, pelvic relaxation issues, and urinary complaints.  Musculoskeletal: Denied musculoskeletal symptoms, stiffness, swelling, muscle weakness and myalgia.  Dermatologic: Denied dermatology symptoms, rash and scar.  Neurologic: Denied neurology symptoms, dizziness, headache, neck pain and syncope.  Psychiatric: Denied psychiatric symptoms, anxiety and depression.  Endocrine: Denied endocrine symptoms including hot flashes and night sweats.   Meds:   Current Outpatient Medications on File Prior to Visit  Medication Sig Dispense Refill  . docusate sodium (COLACE) 100 MG capsule Take 100 mg by mouth 2 (two) times daily.    . Prenatal Vit-Fe Fumarate-FA (PRENATAL MULTIVITAMIN) TABS tablet Take 1 tablet by mouth daily at 12 noon.    . ranitidine (ZANTAC) 150 MG capsule Take 150 mg by mouth 2 (two) times daily.    . simethicone (MYLICON) 125 MG chewable tablet Chew 125 mg by mouth every 6 (six) hours as needed for flatulence.     No current facility-administered  medications on file prior to visit.     Objective:     Vitals:   02/17/18 1344  BP: 118/83  Pulse: 89              ***  Assessment:    G2P0010 Patient Active Problem List   Diagnosis Date Noted  . Supervision of normal pregnancy 12/21/2017  . Anxiety and depression 01/26/2017     1. Anxiety and depression   2. Obesity affecting pregnancy in second trimester   3. Encounter for supervision of other normal pregnancy in second trimester     ***   Plan:            1.  *** Orders No orders of the defined types were placed in this encounter.   No orders of the defined types were placed in this encounter.     F/U  Return in about 1 month (around 03/17/2018).  Elonda Husky, M.D. 02/17/2018 2:12 PM

## 2018-02-17 NOTE — Progress Notes (Signed)
ROB-pt present today due to depression. PHQ-9 Score=14. Pt was crying when we went over the questions for the screening. Pt feels like her mother is overbearing and she personal has a lot going on. Pt was taking Zoloft but stopped taking it when she found out she was pregnant and feels like she needs something (medication) to help her balance out(stable her emotions).

## 2018-02-22 ENCOUNTER — Ambulatory Visit (INDEPENDENT_AMBULATORY_CARE_PROVIDER_SITE_OTHER): Payer: BC Managed Care – PPO | Admitting: Obstetrics and Gynecology

## 2018-02-22 VITALS — BP 92/66 | HR 98

## 2018-02-22 DIAGNOSIS — Z1379 Encounter for other screening for genetic and chromosomal anomalies: Secondary | ICD-10-CM

## 2018-02-22 DIAGNOSIS — Z3482 Encounter for supervision of other normal pregnancy, second trimester: Secondary | ICD-10-CM

## 2018-02-22 DIAGNOSIS — O99210 Obesity complicating pregnancy, unspecified trimester: Secondary | ICD-10-CM

## 2018-02-22 LAB — POCT URINALYSIS DIPSTICK
Bilirubin, UA: NEGATIVE
Blood, UA: NEGATIVE
Glucose, UA: NEGATIVE
Ketones, UA: NEGATIVE
Leukocytes, UA: NEGATIVE
NITRITE UA: NEGATIVE
PH UA: 6 (ref 5.0–8.0)
PROTEIN UA: NEGATIVE
Spec Grav, UA: 1.025 (ref 1.010–1.025)
UROBILINOGEN UA: 0.2 U/dL

## 2018-02-22 NOTE — Progress Notes (Signed)
ROB pt is doing well no concerns.  

## 2018-02-22 NOTE — Progress Notes (Signed)
ROB: Patient doing well, no major complaints. All general concerns regarding pregnancy addressed. For early glucola and AFP within the next week. RTC in 4 weeks for anatomy scan and visit.

## 2018-02-22 NOTE — Patient Instructions (Signed)
Second Trimester of Pregnancy The second trimester is from week 13 through week 28, month 4 through 6. This is often the time in pregnancy that you feel your best. Often times, morning sickness has lessened or quit. You may have more energy, and you may get hungry more often. Your unborn baby (fetus) is growing rapidly. At the end of the sixth month, he or she is about 9 inches long and weighs about 1 pounds. You will likely feel the baby move (quickening) between 18 and 20 weeks of pregnancy. Follow these instructions at home:  Avoid all smoking, herbs, and alcohol. Avoid drugs not approved by your doctor.  Do not use any tobacco products, including cigarettes, chewing tobacco, and electronic cigarettes. If you need help quitting, ask your doctor. You may get counseling or other support to help you quit.  Only take medicine as told by your doctor. Some medicines are safe and some are not during pregnancy.  Exercise only as told by your doctor. Stop exercising if you start having cramps.  Eat regular, healthy meals.  Wear a good support bra if your breasts are tender.  Do not use hot tubs, steam rooms, or saunas.  Wear your seat belt when driving.  Avoid raw meat, uncooked cheese, and liter boxes and soil used by cats.  Take your prenatal vitamins.  Take 1500-2000 milligrams of calcium daily starting at the 20th week of pregnancy until you deliver your baby.  Try taking medicine that helps you poop (stool softener) as needed, and if your doctor approves. Eat more fiber by eating fresh fruit, vegetables, and whole grains. Drink enough fluids to keep your pee (urine) clear or pale yellow.  Take warm water baths (sitz baths) to soothe pain or discomfort caused by hemorrhoids. Use hemorrhoid cream if your doctor approves.  If you have puffy, bulging veins (varicose veins), wear support hose. Raise (elevate) your feet for 15 minutes, 3-4 times a day. Limit salt in your diet.  Avoid heavy  lifting, wear low heals, and sit up straight.  Rest with your legs raised if you have leg cramps or low back pain.  Visit your dentist if you have not gone during your pregnancy. Use a soft toothbrush to brush your teeth. Be gentle when you floss.  You can have sex (intercourse) unless your doctor tells you not to.  Go to your doctor visits. Get help if:  You feel dizzy.  You have mild cramps or pressure in your lower belly (abdomen).  You have a nagging pain in your belly area.  You continue to feel sick to your stomach (nauseous), throw up (vomit), or have watery poop (diarrhea).  You have bad smelling fluid coming from your vagina.  You have pain with peeing (urination). Get help right away if:  You have a fever.  You are leaking fluid from your vagina.  You have spotting or bleeding from your vagina.  You have severe belly cramping or pain.  You lose or gain weight rapidly.  You have trouble catching your breath and have chest pain.  You notice sudden or extreme puffiness (swelling) of your face, hands, ankles, feet, or legs.  You have not felt the baby move in over an hour.  You have severe headaches that do not go away with medicine.  You have vision changes. This information is not intended to replace advice given to you by your health care provider. Make sure you discuss any questions you have with your health care   provider. Document Released: 12/16/2009 Document Revised: 02/27/2016 Document Reviewed: 11/22/2012 Elsevier Interactive Patient Education  2017 [employer]  

## 2018-03-04 ENCOUNTER — Other Ambulatory Visit: Payer: BC Managed Care – PPO

## 2018-03-04 DIAGNOSIS — Z3482 Encounter for supervision of other normal pregnancy, second trimester: Secondary | ICD-10-CM

## 2018-03-04 DIAGNOSIS — Z1379 Encounter for other screening for genetic and chromosomal anomalies: Secondary | ICD-10-CM

## 2018-03-04 DIAGNOSIS — O99210 Obesity complicating pregnancy, unspecified trimester: Secondary | ICD-10-CM

## 2018-03-05 LAB — GLUCOSE, 1 HOUR GESTATIONAL: Gestational Diabetes Screen: 73 mg/dL (ref 65–139)

## 2018-03-07 ENCOUNTER — Encounter: Payer: Self-pay | Admitting: Obstetrics and Gynecology

## 2018-03-09 LAB — AFP, SERUM, OPEN SPINA BIFIDA
AFP MoM: 0.97
AFP Value: 35.7 ng/mL
GEST. AGE ON COLLECTION DATE: 18.3 wk
MATERNAL AGE AT EDD: 25.8 a
OSBR Risk 1 IN: 10000
TEST RESULTS AFP: NEGATIVE
WEIGHT: 199 [lb_av]

## 2018-03-24 ENCOUNTER — Ambulatory Visit (INDEPENDENT_AMBULATORY_CARE_PROVIDER_SITE_OTHER): Payer: BC Managed Care – PPO | Admitting: Obstetrics and Gynecology

## 2018-03-24 ENCOUNTER — Ambulatory Visit (INDEPENDENT_AMBULATORY_CARE_PROVIDER_SITE_OTHER): Payer: BC Managed Care – PPO

## 2018-03-24 VITALS — BP 118/74 | HR 79 | Wt 210.1 lb

## 2018-03-24 DIAGNOSIS — Z5321 Procedure and treatment not carried out due to patient leaving prior to being seen by health care provider: Secondary | ICD-10-CM

## 2018-03-24 DIAGNOSIS — Z3482 Encounter for supervision of other normal pregnancy, second trimester: Secondary | ICD-10-CM

## 2018-03-24 LAB — POCT URINALYSIS DIPSTICK
BILIRUBIN UA: NEGATIVE
Glucose, UA: NEGATIVE
KETONES UA: NEGATIVE
Leukocytes, UA: NEGATIVE
NITRITE UA: NEGATIVE
PH UA: 7 (ref 5.0–8.0)
PROTEIN UA: NEGATIVE
RBC UA: NEGATIVE
Spec Grav, UA: <=1.005 — AB (ref 1.010–1.025)
UROBILINOGEN UA: 0.2 U/dL

## 2018-03-24 NOTE — Progress Notes (Signed)
ROB-pt stated that she doing well no concerns.  

## 2018-03-29 ENCOUNTER — Other Ambulatory Visit: Payer: Self-pay | Admitting: Obstetrics and Gynecology

## 2018-03-29 DIAGNOSIS — Z0489 Encounter for examination and observation for other specified reasons: Secondary | ICD-10-CM

## 2018-03-29 DIAGNOSIS — IMO0002 Reserved for concepts with insufficient information to code with codable children: Secondary | ICD-10-CM

## 2018-04-11 ENCOUNTER — Encounter: Payer: Self-pay | Admitting: Obstetrics and Gynecology

## 2018-04-11 ENCOUNTER — Encounter: Payer: BC Managed Care – PPO | Admitting: Obstetrics and Gynecology

## 2018-04-11 ENCOUNTER — Ambulatory Visit (INDEPENDENT_AMBULATORY_CARE_PROVIDER_SITE_OTHER): Payer: BC Managed Care – PPO

## 2018-04-11 ENCOUNTER — Other Ambulatory Visit: Payer: BC Managed Care – PPO

## 2018-04-11 ENCOUNTER — Ambulatory Visit (INDEPENDENT_AMBULATORY_CARE_PROVIDER_SITE_OTHER): Payer: BC Managed Care – PPO | Admitting: Obstetrics and Gynecology

## 2018-04-11 VITALS — BP 102/70 | HR 101 | Wt 216.6 lb

## 2018-04-11 DIAGNOSIS — Z3482 Encounter for supervision of other normal pregnancy, second trimester: Secondary | ICD-10-CM | POA: Diagnosis not present

## 2018-04-11 DIAGNOSIS — O4442 Low lying placenta NOS or without hemorrhage, second trimester: Secondary | ICD-10-CM

## 2018-04-11 DIAGNOSIS — IMO0002 Reserved for concepts with insufficient information to code with codable children: Secondary | ICD-10-CM

## 2018-04-11 DIAGNOSIS — Z3A23 23 weeks gestation of pregnancy: Secondary | ICD-10-CM | POA: Diagnosis not present

## 2018-04-11 DIAGNOSIS — Z0489 Encounter for examination and observation for other specified reasons: Secondary | ICD-10-CM

## 2018-04-11 LAB — POCT URINALYSIS DIPSTICK
Bilirubin, UA: NEGATIVE
Blood, UA: NEGATIVE
Glucose, UA: NEGATIVE
KETONES UA: NEGATIVE
LEUKOCYTES UA: NEGATIVE
NITRITE UA: NEGATIVE
ODOR: NEGATIVE
PH UA: 6 (ref 5.0–8.0)
PROTEIN UA: POSITIVE — AB
SPEC GRAV UA: 1.025 (ref 1.010–1.025)
UROBILINOGEN UA: 0.2 U/dL

## 2018-04-11 NOTE — Progress Notes (Signed)
ROB-  Vaginal irritation- itching, burns, and redness. Nystatin triamcinolone not working as well.

## 2018-04-11 NOTE — Progress Notes (Signed)
ROB:  No problems.  U/S - anatomy complete.  LLP still there. - recheck U/S at 28 wks.

## 2018-04-25 ENCOUNTER — Other Ambulatory Visit: Payer: Self-pay | Admitting: Obstetrics and Gynecology

## 2018-04-25 DIAGNOSIS — O4440 Low lying placenta NOS or without hemorrhage, unspecified trimester: Secondary | ICD-10-CM

## 2018-05-05 ENCOUNTER — Ambulatory Visit (INDEPENDENT_AMBULATORY_CARE_PROVIDER_SITE_OTHER): Payer: BC Managed Care – PPO | Admitting: Obstetrics and Gynecology

## 2018-05-05 ENCOUNTER — Encounter: Payer: Self-pay | Admitting: Obstetrics and Gynecology

## 2018-05-05 ENCOUNTER — Other Ambulatory Visit (HOSPITAL_COMMUNITY)
Admission: RE | Admit: 2018-05-05 | Discharge: 2018-05-05 | Disposition: A | Discharge status: Home / Self Care | Payer: BC Managed Care – PPO | Source: Ambulatory Visit | Attending: Obstetrics and Gynecology | Admitting: Obstetrics and Gynecology

## 2018-05-05 VITALS — BP 106/75 | HR 85 | Wt 230.0 lb

## 2018-05-05 DIAGNOSIS — Z113 Encounter for screening for infections with a predominantly sexual mode of transmission: Secondary | ICD-10-CM | POA: Insufficient documentation

## 2018-05-05 DIAGNOSIS — Z331 Pregnant state, incidental: Secondary | ICD-10-CM | POA: Diagnosis not present

## 2018-05-05 DIAGNOSIS — Z124 Encounter for screening for malignant neoplasm of cervix: Secondary | ICD-10-CM | POA: Insufficient documentation

## 2018-05-05 DIAGNOSIS — N898 Other specified noninflammatory disorders of vagina: Secondary | ICD-10-CM | POA: Insufficient documentation

## 2018-05-05 DIAGNOSIS — Z3482 Encounter for supervision of other normal pregnancy, second trimester: Secondary | ICD-10-CM

## 2018-05-05 LAB — POCT URINALYSIS DIPSTICK
BILIRUBIN UA: NEGATIVE
Blood, UA: NEGATIVE
GLUCOSE UA: NEGATIVE
Ketones, UA: NEGATIVE
Nitrite, UA: NEGATIVE
Protein, UA: NEGATIVE
Urobilinogen, UA: 0.2 E.U./dL
pH, UA: 7.5 (ref 5.0–8.0)

## 2018-05-05 NOTE — Progress Notes (Signed)
HPI:      [name] is a 27 y.o. G2P0010 who LMP was Patient's last menstrual period was 10/29/2017 (approximate).  Subjective:   She presents today complaining of severe vaginal itching.  She says it is getting worse.  She has had it for several weeks and it is now waking her from sleep and she is quite emotional about it.  She reports no new sexual partners.  She denies vaginal discharge.  She has used innumerable "anti-itch creams and powders without success". Has not tried antifungals. Has a remote history of trichomonas previously treated.    Hx: The following portions of the patient's history were reviewed and updated as appropriate:             She  has a past medical history of Anxiety, Depression, History of Papanicolaou smear of cervix (11/06/14; 02/06/16), and STD (sexually transmitted disease). She does not have any pertinent problems on file. She  has a past surgical history that includes elective abortion (11/2013). Her family history includes Alcoholism in her father; Breast cancer (age of onset: 23) in her maternal grandmother; Diabetes in her maternal grandfather; Renal Disease in her maternal grandfather. She  reports that she has never smoked. She has never used smokeless tobacco. She reports that she does not drink alcohol or use drugs. She has a current medication list which includes the following prescription(s): calcium citrate, docusate sodium, prenatal multivitamin, ranitidine, sertraline, and simethicone. She has No Known Allergies.       Review of Systems:  Review of Systems  Constitutional: Denied constitutional symptoms, night sweats, recent illness, fatigue, fever, insomnia and weight loss.  Eyes: Denied eye symptoms, eye pain, photophobia, vision change and visual disturbance.  Ears/Nose/Throat/Neck: Denied ear, nose, throat or neck symptoms, hearing loss, nasal discharge, sinus congestion and sore throat.  Cardiovascular: Denied cardiovascular symptoms,  arrhythmia, chest pain/pressure, edema, exercise intolerance, orthopnea and palpitations.  Respiratory: Denied pulmonary symptoms, asthma, pleuritic pain, productive sputum, cough, dyspnea and wheezing.  Gastrointestinal: Denied, gastro-esophageal reflux, melena, nausea and vomiting.  Genitourinary: See HPI for additional information.  Musculoskeletal: Denied musculoskeletal symptoms, stiffness, swelling, muscle weakness and myalgia.  Dermatologic: Denied dermatology symptoms, rash and scar.  Neurologic: Denied neurology symptoms, dizziness, headache, neck pain and syncope.  Psychiatric: Denied psychiatric symptoms, anxiety and depression.  Endocrine: Denied endocrine symptoms including hot flashes and night sweats.   Meds:   Current Outpatient Medications on File Prior to Visit  Medication Sig Dispense Refill  . calcium citrate (CALCITRATE - DOSED IN MG ELEMENTAL CALCIUM) 950 MG tablet Take 200 mg of elemental calcium by mouth daily.    Marland Kitchen docusate sodium (COLACE) 100 MG capsule Take 100 mg by mouth 2 (two) times daily.    . Prenatal Vit-Fe Fumarate-FA (PRENATAL MULTIVITAMIN) TABS tablet Take 1 tablet by mouth daily at 12 noon.    . ranitidine (ZANTAC) 150 MG capsule Take 150 mg by mouth 2 (two) times daily.    . sertraline (ZOLOFT) 50 MG tablet   1  . simethicone (MYLICON) 125 MG chewable tablet Chew 125 mg by mouth every 6 (six) hours as needed for flatulence.     No current facility-administered medications on file prior to visit.     Objective:     Vitals:   05/05/18 1106  BP: 106/75  Pulse: 85              Physical examination   Pelvic:   Vulva:  Erythematous no lesions.  Vagina: No  lesions or abnormalities noted.  Support: Normal pelvic support.  Urethra No masses tenderness or scarring.  Meatus Normal size without lesions or prolapse.  Cervix: Normal appearance.  No lesions.  Anus: Normal exam.  No lesions.  Perineum: Normal exam.  No lesions.        Bimanual    Uterus:  Enlarged to 29 weeks non-tender.  Mobile.  AV.  Positive fetal heart tones 136  Adnexae: No masses.  Non-tender to palpation.  Cul-de-sac: Negative for abnormality.     Assessment:    G2P0010 Patient Active Problem List   Diagnosis Date Noted  . Obesity in pregnancy 02/22/2018  . Supervision of normal pregnancy 12/21/2017  . Anxiety and depression 01/26/2017     1. Vaginal itching   2. Encounter for supervision of other normal pregnancy in second trimester     Persistent severe vaginal itching.    Plan:            1.  New swab performed  2.  Advised patient to use Monistat as directed  3.  Follow-up Monday   Orders Orders Placed This Encounter  Procedures  . POCT urinalysis dipstick    No orders of the defined types were placed in this encounter.     F/U  Return for As scheduled. I spent 17 minutes involved in the care of this patient of which greater than 50% was spent discussing vulvar itching burning discharge, over-the-counter remedies patient has tried, history of STDs etc.  Elonda Huskyavid J. Evans, M.D. 05/05/2018 11:26 AM

## 2018-05-05 NOTE — Progress Notes (Signed)
Pt states extreme vaginal itching, swelling and fever,slight odor, burning during urination, pain with sex.Pt also states that all previous suggestions have been tried with no progress.

## 2018-05-06 LAB — CERVICOVAGINAL ANCILLARY ONLY
Bacterial vaginitis: POSITIVE — AB
CANDIDA VAGINITIS: POSITIVE — AB
Chlamydia: NEGATIVE
Neisseria Gonorrhea: NEGATIVE
Trichomonas: NEGATIVE

## 2018-05-09 ENCOUNTER — Ambulatory Visit (INDEPENDENT_AMBULATORY_CARE_PROVIDER_SITE_OTHER): Payer: BC Managed Care – PPO

## 2018-05-09 ENCOUNTER — Ambulatory Visit: Payer: BC Managed Care – PPO

## 2018-05-09 ENCOUNTER — Ambulatory Visit (INDEPENDENT_AMBULATORY_CARE_PROVIDER_SITE_OTHER): Payer: BC Managed Care – PPO | Admitting: Obstetrics and Gynecology

## 2018-05-09 ENCOUNTER — Encounter: Payer: Self-pay | Admitting: Obstetrics and Gynecology

## 2018-05-09 VITALS — BP 111/74 | HR 105 | Wt 232.5 lb

## 2018-05-09 DIAGNOSIS — N898 Other specified noninflammatory disorders of vagina: Secondary | ICD-10-CM

## 2018-05-09 DIAGNOSIS — O4443 Low lying placenta NOS or without hemorrhage, third trimester: Secondary | ICD-10-CM

## 2018-05-09 DIAGNOSIS — Z3A28 28 weeks gestation of pregnancy: Secondary | ICD-10-CM

## 2018-05-09 DIAGNOSIS — O4440 Low lying placenta NOS or without hemorrhage, unspecified trimester: Secondary | ICD-10-CM

## 2018-05-09 DIAGNOSIS — Z3482 Encounter for supervision of other normal pregnancy, second trimester: Secondary | ICD-10-CM

## 2018-05-09 LAB — POCT URINALYSIS DIPSTICK
Bilirubin, UA: NEGATIVE
Glucose, UA: NEGATIVE
Ketones, UA: NEGATIVE
LEUKOCYTES UA: NEGATIVE
Nitrite, UA: NEGATIVE
PH UA: 6 (ref 5.0–8.0)
Protein, UA: NEGATIVE
RBC UA: NEGATIVE
Spec Grav, UA: 1.01 (ref 1.010–1.025)
Urobilinogen, UA: 0.2 E.U./dL

## 2018-05-09 MED ORDER — TETANUS-DIPHTH-ACELL PERTUSSIS 5-2.5-18.5 LF-MCG/0.5 IM SUSP
0.5000 mL | Freq: Once | INTRAMUSCULAR | Status: AC
  Administered 2018-05-09: 0.5 mL via INTRAMUSCULAR

## 2018-05-09 NOTE — Progress Notes (Signed)
ROB: Patient received significant relief from use of Monistat.  Will now treat for BV with Flagyl (see culture results).  Ultrasound reveals a 54th percentile and placenta remains low-lying.  Recommend follow-up ultrasound at 32 weeks.  Signs and symptoms and risks associated with low-lying placenta that discussed in detail warnings given.

## 2018-05-10 ENCOUNTER — Telehealth: Payer: Self-pay | Admitting: Obstetrics and Gynecology

## 2018-05-10 LAB — GLUCOSE, 1 HOUR GESTATIONAL: GESTATIONAL DIABETES SCREEN: 65 mg/dL (ref 65–139)

## 2018-05-10 LAB — CBC
HEMATOCRIT: 38.8 % (ref 34.0–46.6)
Hemoglobin: 12.1 g/dL (ref 11.1–15.9)
MCH: 29.7 pg (ref 26.6–33.0)
MCHC: 31.2 g/dL — ABNORMAL LOW (ref 31.5–35.7)
MCV: 95 fL (ref 79–97)
PLATELETS: 220 10*3/uL (ref 150–450)
RBC: 4.07 x10E6/uL (ref 3.77–5.28)
RDW: 12.9 % (ref 12.3–15.4)
WBC: 6.8 10*3/uL (ref 3.4–10.8)

## 2018-05-10 LAB — RPR: RPR Ser Ql: NONREACTIVE

## 2018-05-10 MED ORDER — METRONIDAZOLE 500 MG PO TABS: 500.0000 mg | ORAL_TABLET | Freq: Two times a day (BID) | ORAL | 0 refills | Status: DC

## 2018-05-10 NOTE — Addendum Note (Signed)
Addended by: Haskel KhanSMITH, Jazlin Tapscott M on: 05/10/2018 04:11 PM   Modules accepted: Orders

## 2018-05-10 NOTE — Telephone Encounter (Signed)
The patient called because when she went to pick up her medication from yesterday's visit with Dr. Logan BoresEvans, the pharmacy did not have a record of receiving an order for her.  She is asking for that to be sent to them, please advise, thanks.

## 2018-05-10 NOTE — Telephone Encounter (Signed)
Pt was informed that Rx has been sent to the Pharmacy for her to pick up.

## 2018-05-11 ENCOUNTER — Other Ambulatory Visit: Payer: Self-pay | Admitting: Obstetrics and Gynecology

## 2018-05-11 DIAGNOSIS — O4443 Low lying placenta NOS or without hemorrhage, third trimester: Secondary | ICD-10-CM

## 2018-05-25 ENCOUNTER — Inpatient Hospital Stay
Admission: EM | Admit: 2018-05-25 | Discharge: 2018-05-25 | Disposition: A | Discharge status: Home / Self Care | Payer: BC Managed Care – PPO | Attending: Obstetrics and Gynecology | Admitting: Obstetrics and Gynecology

## 2018-05-25 ENCOUNTER — Ambulatory Visit (INDEPENDENT_AMBULATORY_CARE_PROVIDER_SITE_OTHER): Payer: BC Managed Care – PPO

## 2018-05-25 DIAGNOSIS — O9A213 Injury, poisoning and certain other consequences of external causes complicating pregnancy, third trimester: Secondary | ICD-10-CM

## 2018-05-25 DIAGNOSIS — Z3A32 32 weeks gestation of pregnancy: Secondary | ICD-10-CM

## 2018-05-25 DIAGNOSIS — Z3A30 30 weeks gestation of pregnancy: Secondary | ICD-10-CM

## 2018-05-25 DIAGNOSIS — O4443 Low lying placenta NOS or without hemorrhage, third trimester: Secondary | ICD-10-CM | POA: Diagnosis not present

## 2018-05-25 DIAGNOSIS — Z3482 Encounter for supervision of other normal pregnancy, second trimester: Secondary | ICD-10-CM

## 2018-05-25 DIAGNOSIS — S3991XA Unspecified injury of abdomen, initial encounter: Secondary | ICD-10-CM

## 2018-05-25 MED ORDER — ACETAMINOPHEN ER 650 MG PO TBCR: 650.0000 mg | EXTENDED_RELEASE_TABLET | Freq: Three times a day (TID) | ORAL | 1 refills | Status: DC | PRN

## 2018-05-25 MED ORDER — ACETAMINOPHEN 325 MG PO TABS
650.0000 mg | ORAL_TABLET | Freq: Once | ORAL | Status: AC
  Administered 2018-05-25: 650 mg via ORAL
  Filled 2018-05-25: qty 2

## 2018-05-25 MED ORDER — CYCLOBENZAPRINE HCL 10 MG PO TABS: 10.0000 mg | ORAL_TABLET | Freq: Every day | ORAL | 0 refills | Status: DC
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
## 2018-05-30 ENCOUNTER — Ambulatory Visit (INDEPENDENT_AMBULATORY_CARE_PROVIDER_SITE_OTHER): Payer: BC Managed Care – PPO | Admitting: Obstetrics and Gynecology

## 2018-05-30 ENCOUNTER — Encounter: Payer: Self-pay | Admitting: Obstetrics and Gynecology

## 2018-05-30 VITALS — BP 104/75 | HR 98 | Wt 238.9 lb

## 2018-05-30 DIAGNOSIS — N761 Subacute and chronic vaginitis: Secondary | ICD-10-CM

## 2018-05-30 DIAGNOSIS — O9989 Other specified diseases and conditions complicating pregnancy, childbirth and the puerperium: Secondary | ICD-10-CM

## 2018-05-30 DIAGNOSIS — Z3483 Encounter for supervision of other normal pregnancy, third trimester: Secondary | ICD-10-CM

## 2018-05-30 DIAGNOSIS — O2603 Excessive weight gain in pregnancy, third trimester: Secondary | ICD-10-CM | POA: Insufficient documentation

## 2018-05-30 DIAGNOSIS — Z79899 Other long term (current) drug therapy: Secondary | ICD-10-CM

## 2018-05-30 LAB — POCT URINALYSIS DIPSTICK OB
Bilirubin, UA: NEGATIVE
GLUCOSE, UA: NEGATIVE — AB
Ketones, UA: NEGATIVE
Nitrite, UA: NEGATIVE
SPEC GRAV UA: 1.025 (ref 1.010–1.025)
Urobilinogen, UA: 0.2 E.U./dL
pH, UA: 6 (ref 5.0–8.0)

## 2018-05-30 MED ORDER — FLUCONAZOLE 150 MG PO TABS: 150.0000 mg | ORAL_TABLET | Freq: Once | ORAL | 3 refills | Status: AC

## 2018-05-30 NOTE — Progress Notes (Signed)
ROB- PT stated that she is having yeast infections symptoms that are not clearing up with Monistat or Flagyl. Pt stated that she has token 2 Monistat and finished the Flagyl and the symptoms are still there. Pt stated that she has mild cramps and left pelvic pain.  No complaints.
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
## 2018-05-31 ENCOUNTER — Encounter: Payer: BC Managed Care – PPO | Admitting: Obstetrics and Gynecology

## 2018-06-15 ENCOUNTER — Encounter: Payer: Self-pay | Admitting: Obstetrics and Gynecology

## 2018-06-15 ENCOUNTER — Ambulatory Visit (INDEPENDENT_AMBULATORY_CARE_PROVIDER_SITE_OTHER): Payer: BC Managed Care – PPO | Admitting: Obstetrics and Gynecology

## 2018-06-15 VITALS — BP 112/73 | HR 112 | Wt 244.0 lb

## 2018-06-15 DIAGNOSIS — O99210 Obesity complicating pregnancy, unspecified trimester: Secondary | ICD-10-CM

## 2018-06-15 DIAGNOSIS — Z23 Encounter for immunization: Secondary | ICD-10-CM

## 2018-06-15 DIAGNOSIS — Z3483 Encounter for supervision of other normal pregnancy, third trimester: Secondary | ICD-10-CM

## 2018-06-15 DIAGNOSIS — O99213 Obesity complicating pregnancy, third trimester: Secondary | ICD-10-CM

## 2018-06-15 LAB — POCT URINALYSIS DIPSTICK OB
BILIRUBIN UA: NEGATIVE
Glucose, UA: NEGATIVE
Ketones, UA: NEGATIVE
LEUKOCYTES UA: NEGATIVE
NITRITE UA: NEGATIVE
PH UA: 6 (ref 5.0–8.0)
PROTEIN: NEGATIVE
RBC UA: NEGATIVE
Spec Grav, UA: >=1.03 — AB (ref 1.010–1.025)
UROBILINOGEN UA: 0.2 U/dL

## 2018-06-15 NOTE — Progress Notes (Signed)
ROB: Patient doing well.  Occasional Braxton Hicks contractions.  Occasional leg cramps, discussed strategies.  Cultures next visit.

## 2018-06-15 NOTE — Progress Notes (Signed)
Pt presents today for ROB. Pt states her feet are beginning to swell and she is feeling a lot of pressure in her lower abdomin. Otherwise pt is feeling well with no issues.

## 2018-06-21 ENCOUNTER — Other Ambulatory Visit: Payer: Self-pay | Admitting: Obstetrics and Gynecology

## 2018-06-21 DIAGNOSIS — N898 Other specified noninflammatory disorders of vagina: Secondary | ICD-10-CM

## 2018-06-21 DIAGNOSIS — F419 Anxiety disorder, unspecified: Secondary | ICD-10-CM

## 2018-06-21 DIAGNOSIS — F329 Major depressive disorder, single episode, unspecified: Secondary | ICD-10-CM

## 2018-06-22 ENCOUNTER — Other Ambulatory Visit: Payer: Self-pay

## 2018-06-22 DIAGNOSIS — F32A Depression, unspecified: Secondary | ICD-10-CM

## 2018-06-22 DIAGNOSIS — F419 Anxiety disorder, unspecified: Principal | ICD-10-CM

## 2018-06-22 DIAGNOSIS — F329 Major depressive disorder, single episode, unspecified: Secondary | ICD-10-CM

## 2018-06-22 MED ORDER — SERTRALINE HCL 50 MG PO TABS: 50.0000 mg | ORAL_TABLET | Freq: Every day | ORAL | 0 refills | Status: DC

## 2018-06-22 NOTE — Telephone Encounter (Signed)
The patient states a refill request was sent over by the pharmacy but her refill is not received back.  She is asking for someone to call her about her script as she is now out of her medicine.  She was told Dr. Logan BoresEvans is out on vacation this week.  Please advise, thanks.

## 2018-06-22 NOTE — Telephone Encounter (Signed)
Pt did not answer the phone to be informed of her Rx refill. Will send MyChart message.

## 2018-06-28 ENCOUNTER — Ambulatory Visit (INDEPENDENT_AMBULATORY_CARE_PROVIDER_SITE_OTHER): Payer: BC Managed Care – PPO | Admitting: Obstetrics and Gynecology

## 2018-06-28 VITALS — BP 103/70 | HR 112 | Wt 248.4 lb

## 2018-06-28 DIAGNOSIS — O26899 Other specified pregnancy related conditions, unspecified trimester: Secondary | ICD-10-CM

## 2018-06-28 DIAGNOSIS — Z3483 Encounter for supervision of other normal pregnancy, third trimester: Secondary | ICD-10-CM

## 2018-06-28 DIAGNOSIS — M7989 Other specified soft tissue disorders: Secondary | ICD-10-CM

## 2018-06-28 DIAGNOSIS — R102 Pelvic and perineal pain unspecified side: Secondary | ICD-10-CM

## 2018-06-28 DIAGNOSIS — Z3A35 35 weeks gestation of pregnancy: Secondary | ICD-10-CM

## 2018-06-28 LAB — POCT URINALYSIS DIPSTICK OB
BILIRUBIN UA: NEGATIVE
Blood, UA: NEGATIVE
GLUCOSE, UA: NEGATIVE
KETONES UA: NEGATIVE
NITRITE UA: NEGATIVE
Urobilinogen, UA: 0.2 E.U./dL
pH, UA: 6 (ref 5.0–8.0)

## 2018-06-28 NOTE — Progress Notes (Signed)
ROB: Patient complains of pelvic pressure. Also notes Deberah PeltonBraxton Hicks. Declines exam today. RTC in 1 week for 36 week cultures. C/o swelling of hands and feet, usually worse in evenings, resolved by a.m. BPs wnl. Advised on ted hose, elevating feet when resting.

## 2018-06-28 NOTE — Progress Notes (Signed)
ROB-Pt stated that she is having a lot of lower pelvic pressure with sharp pains. Pt stated feet/hand swollen; hands mainly at night.

## 2018-07-01 NOTE — Progress Notes (Signed)
Pt presents today for ROB and 36 week cultures. Pt is feeling well and has no concerns.

## 2018-07-04 ENCOUNTER — Ambulatory Visit (INDEPENDENT_AMBULATORY_CARE_PROVIDER_SITE_OTHER): Payer: BC Managed Care – PPO | Admitting: Obstetrics and Gynecology

## 2018-07-04 ENCOUNTER — Encounter: Payer: Self-pay | Admitting: Obstetrics and Gynecology

## 2018-07-04 VITALS — BP 89/54 | HR 104 | Wt 247.0 lb

## 2018-07-04 DIAGNOSIS — Z3483 Encounter for supervision of other normal pregnancy, third trimester: Secondary | ICD-10-CM

## 2018-07-04 LAB — POCT URINALYSIS DIPSTICK OB
BILIRUBIN UA: NEGATIVE
GLUCOSE, UA: NEGATIVE
KETONES UA: 15
Leukocytes, UA: NEGATIVE
Nitrite, UA: NEGATIVE
PH UA: 6 (ref 5.0–8.0)
RBC UA: NEGATIVE
UROBILINOGEN UA: 0.2 U/dL

## 2018-07-04 NOTE — Progress Notes (Signed)
ROB:  No problems.. CX's done

## 2018-07-07 LAB — STREP GP B NAA: STREP GROUP B AG: POSITIVE — AB

## 2018-07-08 LAB — GC/CHLAMYDIA PROBE AMP
CHLAMYDIA, DNA PROBE: NEGATIVE
Neisseria gonorrhoeae by PCR: NEGATIVE

## 2018-07-11 ENCOUNTER — Encounter: Payer: Self-pay | Admitting: Obstetrics and Gynecology

## 2018-07-11 ENCOUNTER — Ambulatory Visit (INDEPENDENT_AMBULATORY_CARE_PROVIDER_SITE_OTHER): Payer: BC Managed Care – PPO | Admitting: Obstetrics and Gynecology

## 2018-07-11 VITALS — BP 88/62 | HR 106 | Wt 252.7 lb

## 2018-07-11 DIAGNOSIS — Z3483 Encounter for supervision of other normal pregnancy, third trimester: Secondary | ICD-10-CM

## 2018-07-11 DIAGNOSIS — O26843 Uterine size-date discrepancy, third trimester: Secondary | ICD-10-CM

## 2018-07-11 DIAGNOSIS — O99213 Obesity complicating pregnancy, third trimester: Secondary | ICD-10-CM

## 2018-07-11 LAB — POCT URINALYSIS DIPSTICK OB
BILIRUBIN UA: NEGATIVE
Blood, UA: NEGATIVE
Glucose, UA: NEGATIVE
KETONES UA: NEGATIVE
LEUKOCYTES UA: NEGATIVE
NITRITE UA: NEGATIVE
Spec Grav, UA: 1.02 (ref 1.010–1.025)
Urobilinogen, UA: 0.2 E.U./dL
pH, UA: 6.5 (ref 5.0–8.0)

## 2018-07-11 NOTE — Progress Notes (Signed)
ROB-Pt stated that she a lot of pelvic pain, feet and hand swelling. No other complaints.

## 2018-07-11 NOTE — Patient Instructions (Signed)
Group B Streptococcus Infection During Pregnancy Group B Streptococcus (GBS) is a type of bacteria (Streptococcus agalactiae) that is often found in healthy people, commonly in the rectum, vagina, and intestines. In people who are healthy and not pregnant, the bacteria rarely cause serious illness or complications. However, women who test positive for GBS during pregnancy can pass the bacteria to their baby during childbirth, which can cause serious infection in the baby after birth. Women with GBS may also have infections during their pregnancy or immediately after childbirth, such as such as urinary tract infections (UTIs) or infections of the uterus (uterine infections). Having GBS also increases a woman's risk of complications during pregnancy, such as early (preterm) labor or delivery, miscarriage, or stillbirth. Routine testing (screening) for GBS is recommended for all pregnant women. What increases the risk? You may have a higher risk for GBS infection during pregnancy if you had one during a past pregnancy. What are the signs or symptoms? In most cases, GBS infection does not cause symptoms in pregnant women. Signs and symptoms of a possible GBS-related infection may include:  Labor starting before the 37th week of pregnancy.  A UTI or bladder infection, which may cause: ? Fever. ? Pain or burning during urination. ? Frequent urination.  Fever during labor, along with: ? Bad-smelling discharge. ? Uterine tenderness. ? Rapid heartbeat in the mother, baby, or both.  Rare but serious symptoms of a possible GBS-related infection in women include:  Blood infection (septicemia). This may cause fever, chills, or confusion.  Lung infection (pneumonia). This may cause fever, chills, cough, rapid breathing, difficulty breathing, or chest pain.  Bone, joint, skin, or soft tissue infection.  How is this diagnosed? You may be screened for GBS between week 35 and week 37 of your pregnancy. If  you have symptoms of preterm labor, you may be screened earlier. This condition is diagnosed based on lab test results from:  A swab of fluid from the vagina and rectum.  A urine sample.  How is this treated? This condition is treated with antibiotic medicine. When you go into labor, or as soon as your water breaks (your membranes rupture), you will be given antibiotics through an IV tube. Antibiotics will continue until after you give birth. If you are having a cesarean delivery, you do not need antibiotics unless your membranes have already ruptured. Follow these instructions at home:  Take over-the-counter and prescription medicines only as told by your health care provider.  Take your antibiotic medicine as told by your health care provider. Do not stop taking the antibiotic even if you start to feel better.  Keep all pre-birth (prenatal) visits and follow-up visits as told by your health care provider. This is important. Contact a health care provider if:  You have pain or burning when you urinate.  You have to urinate frequently.  You have a fever or chills.  You develop a bad-smelling vaginal discharge. Get help right away if:  Your membranes rupture.  You go into labor.  You have severe pain in your abdomen.  You have difficulty breathing.  You have chest pain. This information is not intended to replace advice given to you by your health care provider. Make sure you discuss any questions you have with your health care provider. Document Released: 12/29/2007 Document Revised: 04/17/2016 Document Reviewed: 04/16/2016 Elsevier Interactive Patient Education  2018 [employer].     Nonstress Test The nonstress test is a procedure that monitors the fetus's heartbeat. The test  will monitor the heartbeat when the fetus is at rest and while the fetus is moving. In a healthy fetus, there will be an increase in fetal heart rate when the fetus moves or kicks. The heart rate  will decrease at rest. This test helps determine if the fetus is healthy. Your health care provider will look at a number of patterns in the heart rate tracing to make sure your baby is thriving. If there is concern, your health care provider may order additional tests or may suggest another course of action. This test is often done in the third trimester and can help determine if an early delivery is needed and safe. Common reasons to have this test are:  You are past your due date.  You have a high-risk pregnancy.  You are feeling less movement than normal.  You have lost a pregnancy in the past.  Your health care provider suspects fetal growth problems.  You have too much or too little amniotic fluid.  What happens before the procedure?  Eat a meal right before the test or as directed by your health care provider. Food may help stimulate fetal movements.  Use the restroom right before the test. What happens during the procedure?  Two belts will be placed around your abdomen. These belts have monitors attached to them. One records the fetal heart rate and the other records uterine contractions.  You may be asked to lie down on your side or to stay sitting upright.  You may be given a button to press when you feel movement.  The fetal heartbeat is listened to and watched on a screen. The heartbeat is recorded on a sheet of paper.  If the fetus seems to be sleeping, you may be asked to drink some juice or soda, gently press your abdomen, or make some noise to wake the fetus. What happens after the procedure? Your health care provider will discuss the test results with you and make recommendations for the near future.  This information is not intended to replace advice given to you by your health care provider. Make sure you discuss any questions you have with your health care provider. This information is not intended to replace advice given to you by your health care provider. Make  sure you discuss any questions you have with your health care provider. Document Released: 09/11/2002 Document Revised: 08/21/2016 Document Reviewed: 10/25/2012 Elsevier Interactive Patient Education  [employer].

## 2018-07-11 NOTE — Progress Notes (Signed)
ROB: Patient notes feet and hand swelling. Discussed adequate hydration, compression stockings and elevation of extremities. GBS+, discussed need for antibiotics in labor. Patient currently with BMI of 40, will need growth scan this week (also has S>D today) and weekly NSTs.  Discussed need for IOL by 40 weeks due to BMI due to increased risk of stillborn with postdates pregnancy and morbid obesity.  RTC in 1 week.

## 2018-07-14 ENCOUNTER — Ambulatory Visit (INDEPENDENT_AMBULATORY_CARE_PROVIDER_SITE_OTHER): Payer: BC Managed Care – PPO

## 2018-07-14 ENCOUNTER — Other Ambulatory Visit: Payer: BC Managed Care – PPO

## 2018-07-14 ENCOUNTER — Encounter: Payer: Self-pay | Admitting: Obstetrics and Gynecology

## 2018-07-14 ENCOUNTER — Ambulatory Visit (INDEPENDENT_AMBULATORY_CARE_PROVIDER_SITE_OTHER): Payer: BC Managed Care – PPO | Admitting: Obstetrics and Gynecology

## 2018-07-14 VITALS — BP 103/65 | HR 93 | Ht 66.0 in | Wt 254.1 lb

## 2018-07-14 DIAGNOSIS — O0993 Supervision of high risk pregnancy, unspecified, third trimester: Secondary | ICD-10-CM | POA: Diagnosis not present

## 2018-07-14 DIAGNOSIS — O26843 Uterine size-date discrepancy, third trimester: Secondary | ICD-10-CM

## 2018-07-14 DIAGNOSIS — Z362 Encounter for other antenatal screening follow-up: Secondary | ICD-10-CM | POA: Diagnosis not present

## 2018-07-14 DIAGNOSIS — O99210 Obesity complicating pregnancy, unspecified trimester: Secondary | ICD-10-CM

## 2018-07-14 LAB — POCT URINALYSIS DIPSTICK OB
BILIRUBIN UA: NEGATIVE
Blood, UA: NEGATIVE
GLUCOSE, UA: NEGATIVE
KETONES UA: NEGATIVE
Leukocytes, UA: NEGATIVE
Nitrite, UA: NEGATIVE
Spec Grav, UA: 1.01 (ref 1.010–1.025)
UROBILINOGEN UA: 0.2 U/dL
pH, UA: 7 (ref 5.0–8.0)

## 2018-07-14 NOTE — Progress Notes (Signed)
ROB-Pt is present today for NST only. Pt stated that the baby is moving, normal pain and pressure. Pt stated that she was doing well no complaints. NST was reviewed and noted as been activated by [facility].

## 2018-07-15 ENCOUNTER — Observation Stay
Admission: EM | Admit: 2018-07-15 | Discharge: 2018-07-15 | Disposition: A | Discharge status: Home / Self Care | Payer: BC Managed Care – PPO | Attending: Obstetrics and Gynecology | Admitting: Obstetrics and Gynecology

## 2018-07-15 ENCOUNTER — Other Ambulatory Visit: Payer: Self-pay

## 2018-07-15 DIAGNOSIS — O368130 Decreased fetal movements, third trimester, not applicable or unspecified: Secondary | ICD-10-CM | POA: Diagnosis not present

## 2018-07-15 DIAGNOSIS — O26893 Other specified pregnancy related conditions, third trimester: Secondary | ICD-10-CM | POA: Diagnosis present

## 2018-07-15 DIAGNOSIS — Z3A37 37 weeks gestation of pregnancy: Secondary | ICD-10-CM | POA: Insufficient documentation

## 2018-07-15 NOTE — OB Triage Note (Signed)
Pt G2P0 presents at [redacted]w[redacted]d with complaints of CTX that started at 11am and have been 7-10 minutes apart. Pt reports she has not had any CTX since being here. Reports she had decreased FM this am but is +FM now. Pt reports feeling dizzy this am that has come and gone the last few days. Denies bleeding/discharge. Monitors applied. VSS.

## 2018-07-15 NOTE — Final Progress Note (Signed)
L&D OB Triage Note  Barbara [name] is a 27 y.o. G2P0010 female at [redacted]w[redacted]d, EDD Estimated Date of Delivery: 08/03/18 who presented to triage for complaints of abdominal pain and irregular contractions. She also noted some decreased fetal movement x 1 day and feeling dizzy.  She was evaluated by the nurses with no significant findings for active labor. Vital signs stable. An NST was performed and has been reviewed by MD. She was treated with PO hydration and food intake.   NST INTERPRETATION: Indications: decreased fetal movement and rule out uterine contractions  Mode: External Baseline Rate (A): 135 bpm Variability: Moderate Accelerations: 15 x 15 Decelerations: None     Contraction Frequency (min): rare  Impression: reactive   Plan: NST performed was reviewed and was found to be reactive after PO challenge. Her other symptoms resolved. She was discharged home with bleeding/labor precautions and counseled on fetal kick counts.  Continue routine prenatal care. Follow up with OB/GYN as previously scheduled.       Hildred Laser, MD [facility]

## 2018-07-19 ENCOUNTER — Other Ambulatory Visit: Payer: BC Managed Care – PPO

## 2018-07-20 ENCOUNTER — Other Ambulatory Visit: Payer: BC Managed Care – PPO

## 2018-07-20 ENCOUNTER — Encounter: Payer: Self-pay | Admitting: Obstetrics and Gynecology

## 2018-07-20 ENCOUNTER — Ambulatory Visit (INDEPENDENT_AMBULATORY_CARE_PROVIDER_SITE_OTHER): Payer: BC Managed Care – PPO | Admitting: Obstetrics and Gynecology

## 2018-07-20 VITALS — BP 122/81 | HR 98 | Wt 257.0 lb

## 2018-07-20 DIAGNOSIS — Z3A38 38 weeks gestation of pregnancy: Secondary | ICD-10-CM

## 2018-07-20 DIAGNOSIS — O99213 Obesity complicating pregnancy, third trimester: Secondary | ICD-10-CM

## 2018-07-20 DIAGNOSIS — Z3483 Encounter for supervision of other normal pregnancy, third trimester: Secondary | ICD-10-CM

## 2018-07-20 LAB — POCT URINALYSIS DIPSTICK OB
Bilirubin, UA: NEGATIVE
Blood, UA: NEGATIVE
Glucose, UA: NEGATIVE
Ketones, UA: NEGATIVE
LEUKOCYTES UA: NEGATIVE
NITRITE UA: NEGATIVE
PROTEIN: NEGATIVE
Spec Grav, UA: 1.015 (ref 1.010–1.025)
Urobilinogen, UA: 0.2 E.U./dL
pH, UA: 7 (ref 5.0–8.0)

## 2018-07-20 NOTE — Progress Notes (Signed)
Pt presents today for ROB and NST. Pt state she has been experiencing contractions 5-10 minutes apart since around 1 pm today.

## 2018-07-20 NOTE — Progress Notes (Signed)
ROB:  C/o contractions.  Not comfortable.  Induction 10/28 scheduled   NONSTRESS TEST INTERPRETATION  INDICATIONS: BMI  RESULTS:  reactive COMMENTS: occ contractions  PLAN: 1. Continue fetal kick counts as directed. 2. Continue antepartum testing as scheduled.  Elonda Husky, M.D. 07/20/2018 4:02 PM

## 2018-07-27 ENCOUNTER — Encounter: Payer: Self-pay | Admitting: Obstetrics and Gynecology

## 2018-07-27 ENCOUNTER — Ambulatory Visit (INDEPENDENT_AMBULATORY_CARE_PROVIDER_SITE_OTHER): Payer: BC Managed Care – PPO | Admitting: Obstetrics and Gynecology

## 2018-07-27 VITALS — BP 110/71 | HR 103 | Wt 258.0 lb

## 2018-07-27 DIAGNOSIS — Z3A39 39 weeks gestation of pregnancy: Secondary | ICD-10-CM

## 2018-07-27 DIAGNOSIS — Z3483 Encounter for supervision of other normal pregnancy, third trimester: Secondary | ICD-10-CM

## 2018-07-27 DIAGNOSIS — O99213 Obesity complicating pregnancy, third trimester: Secondary | ICD-10-CM

## 2018-07-27 LAB — POCT URINALYSIS DIPSTICK OB
BILIRUBIN UA: NEGATIVE
Glucose, UA: NEGATIVE
Ketones, UA: NEGATIVE
Leukocytes, UA: NEGATIVE
NITRITE UA: NEGATIVE
PH UA: 6 (ref 5.0–8.0)
POC,PROTEIN,UA: NEGATIVE
RBC UA: NEGATIVE
Spec Grav, UA: 1.02 (ref 1.010–1.025)
Urobilinogen, UA: 0.2 E.U./dL

## 2018-07-27 NOTE — Addendum Note (Signed)
Addended by: Elonda Husky on: 07/27/2018 04:11 PM   Modules accepted: Orders, SmartSet

## 2018-07-27 NOTE — Progress Notes (Signed)
Pt presents today for ROB and NST.

## 2018-07-27 NOTE — Progress Notes (Signed)
ROB:  Denies contractions.  No complaints.   NONSTRESS TEST INTERPRETATION  INDICATIONS: Increased BMI  RESULTS:  reactive COMMENTS: Induction scheduled for Monday  PLAN: 1. Continue fetal kick counts as directed. 2. Continue antepartum testing as scheduled.  Barbara [name], M.D. 07/27/2018 3:54 PM

## 2018-07-29 ENCOUNTER — Observation Stay
Admission: EM | Admit: 2018-07-29 | Discharge: 2018-07-29 | Disposition: A | Discharge status: Home / Self Care | Payer: BC Managed Care – PPO | Attending: Obstetrics and Gynecology | Admitting: Obstetrics and Gynecology

## 2018-07-29 ENCOUNTER — Encounter: Payer: Self-pay | Admitting: *Deleted

## 2018-07-29 ENCOUNTER — Other Ambulatory Visit: Payer: Self-pay

## 2018-07-29 ENCOUNTER — Telehealth: Payer: Self-pay | Admitting: Obstetrics and Gynecology

## 2018-07-29 DIAGNOSIS — O26893 Other specified pregnancy related conditions, third trimester: Secondary | ICD-10-CM

## 2018-07-29 DIAGNOSIS — Z3493 Encounter for supervision of normal pregnancy, unspecified, third trimester: Secondary | ICD-10-CM

## 2018-07-29 DIAGNOSIS — Z3483 Encounter for supervision of other normal pregnancy, third trimester: Secondary | ICD-10-CM | POA: Diagnosis present

## 2018-07-29 DIAGNOSIS — N898 Other specified noninflammatory disorders of vagina: Secondary | ICD-10-CM

## 2018-07-29 DIAGNOSIS — Z3A39 39 weeks gestation of pregnancy: Secondary | ICD-10-CM | POA: Insufficient documentation

## 2018-07-29 LAB — ROM PLUS (ARMC ONLY): ROM PLUS: NEGATIVE

## 2018-07-29 NOTE — Telephone Encounter (Signed)
The patient called Barbara [name] back in regards to the patient leaking fluid. The patient would lie a call back as soon as possible if possible this morning. No other information was disclosed. Please advise.

## 2018-07-29 NOTE — Telephone Encounter (Signed)
Per DJE., Advised pt if she was sure she may be in labor, she should go to L&D to be checked. Pt understand and said she would keep [place] updated.

## 2018-07-29 NOTE — OB Triage Note (Signed)
Possible SROM Wednesday evening around 1900. Reports leaking fluid since then. Denies gush of fluid. Denies vaginal bleeding. Reports + fetal movement. Barbara [name]

## 2018-07-31 ENCOUNTER — Observation Stay (HOSPITAL_BASED_OUTPATIENT_CLINIC_OR_DEPARTMENT_OTHER)
Admission: EM | Admit: 2018-07-31 | Discharge: 2018-07-31 | Disposition: A | Discharge status: Home / Self Care | Payer: BC Managed Care – PPO | Source: Home / Self Care | Admitting: Obstetrics and Gynecology

## 2018-07-31 ENCOUNTER — Other Ambulatory Visit: Payer: Self-pay

## 2018-07-31 DIAGNOSIS — Z3A39 39 weeks gestation of pregnancy: Secondary | ICD-10-CM | POA: Insufficient documentation

## 2018-07-31 DIAGNOSIS — Z3493 Encounter for supervision of normal pregnancy, unspecified, third trimester: Secondary | ICD-10-CM

## 2018-07-31 DIAGNOSIS — O471 False labor at or after 37 completed weeks of gestation: Secondary | ICD-10-CM

## 2018-07-31 DIAGNOSIS — Z79899 Other long term (current) drug therapy: Secondary | ICD-10-CM | POA: Insufficient documentation

## 2018-07-31 NOTE — Discharge Summary (Signed)
    L&D OB Triage Note  SUBJECTIVE Barbara [name] is a 27 y.o. G2P0010 female at [redacted]w[redacted]d, EDD Estimated Date of Delivery: 08/03/18 who presented to triage with complaints of leaking fluid. Denise bleeding, occ contractions.  OB History  Gravida Para Term Preterm AB Living  2 0 0 0 1 0  SAB TAB Ectopic Multiple Live Births  0 1 0 0 0    # Outcome Date GA Lbr Len/2nd Weight Sex Delivery Anes PTL Lv  2 Current           1 TAB             No medications prior to admission.     OBJECTIVE  Nursing Evaluation:   BP 125/63 (BP Location: Right Arm)   Pulse (!) 109   Temp 98 F (36.7 C)   Resp 18   Ht 5\' 4"  (1.626 m)   Wt 117 kg   LMP 10/29/2017 (Approximate)   BMI 44.29 kg/m    Findings:   ROOM Plus - Negative.  Pt not in labor.  NST was performed and has been reviewed by me.  NST INTERPRETATION: Category I  Mode: External Baseline Rate (A): 125 bpm Variability: Moderate Accelerations: 15 x 15 Decelerations: None     Contraction Frequency (min): none  ASSESSMENT Impression:  1.  Pregnancy:  G2P0010 at [redacted]w[redacted]d , EDD Estimated Date of Delivery: 08/03/18 2.  NST:  Category I  PLAN 1. Reassurance given 2. Discharge home with standard labor precautions given to return to L&D or call the office for problems. 3. Continue routine prenatal care.

## 2018-07-31 NOTE — Progress Notes (Signed)
Notified Dr.Evans of patient admission to the unit, assessment, patients complaints of continued leaking of fluid, Vaginal exam, EFM tracing and uterine contraction pattern. No fluid noted on perineum during exam.  Patients states the fluid has been leaking the same as it was since Wednesday.   Orders to observe patient for one hour and reassess cervix, if no change the plan is for her to be discharged home and return for signs of labor progression or for her IOL in the morning.  No orders for a repeat ROM+, test negative on Wednesday.

## 2018-07-31 NOTE — OB Triage Note (Signed)
Patient presented to L&D with complaints of contractions that started 1.5 hours ago, contractions are every 4-5 minutes.  States she has been leaking clear fluid since Wednesday.  Denies vaginal bleeding, or decreased fetal movement.

## 2018-08-01 ENCOUNTER — Inpatient Hospital Stay
Admission: RE | Admit: 2018-08-01 | Discharge: 2018-08-03 | DRG: 807 | Disposition: A | Discharge status: Home / Self Care | Payer: BC Managed Care – PPO | Attending: Obstetrics and Gynecology | Admitting: Obstetrics and Gynecology

## 2018-08-01 ENCOUNTER — Inpatient Hospital Stay: Payer: BC Managed Care – PPO | Admitting: Anesthesiology

## 2018-08-01 DIAGNOSIS — Z3A39 39 weeks gestation of pregnancy: Secondary | ICD-10-CM

## 2018-08-01 DIAGNOSIS — F419 Anxiety disorder, unspecified: Secondary | ICD-10-CM | POA: Diagnosis present

## 2018-08-01 DIAGNOSIS — O9902 Anemia complicating childbirth: Secondary | ICD-10-CM | POA: Diagnosis present

## 2018-08-01 DIAGNOSIS — O99824 Streptococcus B carrier state complicating childbirth: Secondary | ICD-10-CM | POA: Diagnosis present

## 2018-08-01 DIAGNOSIS — O99344 Other mental disorders complicating childbirth: Secondary | ICD-10-CM | POA: Diagnosis present

## 2018-08-01 DIAGNOSIS — D649 Anemia, unspecified: Secondary | ICD-10-CM | POA: Diagnosis present

## 2018-08-01 DIAGNOSIS — F329 Major depressive disorder, single episode, unspecified: Secondary | ICD-10-CM | POA: Diagnosis present

## 2018-08-01 DIAGNOSIS — Z3493 Encounter for supervision of normal pregnancy, unspecified, third trimester: Secondary | ICD-10-CM

## 2018-08-01 DIAGNOSIS — O99013 Anemia complicating pregnancy, third trimester: Secondary | ICD-10-CM

## 2018-08-01 DIAGNOSIS — Z87891 Personal history of nicotine dependence: Secondary | ICD-10-CM | POA: Diagnosis not present

## 2018-08-01 DIAGNOSIS — O99210 Obesity complicating pregnancy, unspecified trimester: Secondary | ICD-10-CM

## 2018-08-01 DIAGNOSIS — O2603 Excessive weight gain in pregnancy, third trimester: Secondary | ICD-10-CM | POA: Diagnosis present

## 2018-08-01 DIAGNOSIS — R638 Other symptoms and signs concerning food and fluid intake: Secondary | ICD-10-CM | POA: Diagnosis present

## 2018-08-01 DIAGNOSIS — Z6836 Body mass index (BMI) 36.0-36.9, adult: Secondary | ICD-10-CM | POA: Diagnosis present

## 2018-08-01 DIAGNOSIS — O99214 Obesity complicating childbirth: Principal | ICD-10-CM | POA: Diagnosis present

## 2018-08-01 LAB — CBC
HEMATOCRIT: 34.6 % — AB (ref 36.0–46.0)
Hemoglobin: 11.5 g/dL — ABNORMAL LOW (ref 12.0–15.0)
MCH: 29.9 pg (ref 26.0–34.0)
MCHC: 33.2 g/dL (ref 30.0–36.0)
MCV: 90.1 fL (ref 80.0–100.0)
Platelets: 209 10*3/uL (ref 150–400)
RBC: 3.84 MIL/uL — ABNORMAL LOW (ref 3.87–5.11)
RDW: 12.5 % (ref 11.5–15.5)
WBC: 16 10*3/uL — AB (ref 4.0–10.5)
nRBC: 0 % (ref 0.0–0.2)

## 2018-08-01 LAB — TYPE AND SCREEN
ABO/RH(D): O POS
ANTIBODY SCREEN: NEGATIVE

## 2018-08-01 MED ORDER — BENZOCAINE-MENTHOL 20-0.5 % EX AERO
1.0000 "application " | INHALATION_SPRAY | CUTANEOUS | Status: DC | PRN
  Filled 2018-08-01: qty 56

## 2018-08-01 MED ORDER — PHENYLEPHRINE 40 MCG/ML (10ML) SYRINGE FOR IV PUSH (FOR BLOOD PRESSURE SUPPORT)
80.0000 ug | PREFILLED_SYRINGE | INTRAVENOUS | Status: DC | PRN
  Filled 2018-08-01: qty 5

## 2018-08-01 MED ORDER — LIDOCAINE HCL (PF) 1 % IJ SOLN
INTRAMUSCULAR | Status: DC | PRN
  Administered 2018-08-01: 3 mL via SUBCUTANEOUS

## 2018-08-01 MED ORDER — EPHEDRINE 5 MG/ML INJ
10.0000 mg | INTRAVENOUS | Status: DC | PRN
  Administered 2018-08-01: 10 mg via INTRAVENOUS
  Filled 2018-08-01: qty 2

## 2018-08-01 MED ORDER — AMMONIA AROMATIC IN INHA
RESPIRATORY_TRACT | Status: AC
  Filled 2018-08-01: qty 10

## 2018-08-01 MED ORDER — OXYTOCIN 40 UNITS IN LACTATED RINGERS INFUSION - SIMPLE MED
1.0000 m[IU]/min | INTRAVENOUS | Status: DC
  Administered 2018-08-01: 4 m[IU]/min via INTRAVENOUS

## 2018-08-01 MED ORDER — IBUPROFEN 800 MG PO TABS
ORAL_TABLET | ORAL | Status: AC
  Administered 2018-08-01: 800 mg
  Filled 2018-08-01: qty 1

## 2018-08-01 MED ORDER — FENTANYL 2.5 MCG/ML W/ROPIVACAINE 0.15% IN NS 100 ML EPIDURAL (ARMC)
EPIDURAL | Status: DC | PRN
  Administered 2018-08-01: 12 mL/h via EPIDURAL

## 2018-08-01 MED ORDER — LACTATED RINGERS IV SOLN
INTRAVENOUS | Status: DC
  Administered 2018-08-01 (×2): via INTRAVENOUS

## 2018-08-01 MED ORDER — OXYTOCIN 40 UNITS IN LACTATED RINGERS INFUSION - SIMPLE MED
2.5000 [IU]/h | INTRAVENOUS | Status: DC
  Administered 2018-08-01: 2.5 [IU]/h via INTRAVENOUS
  Filled 2018-08-01: qty 1000

## 2018-08-01 MED ORDER — DIPHENHYDRAMINE HCL 25 MG PO CAPS: 25.0000 mg | ORAL_CAPSULE | Freq: Four times a day (QID) | ORAL | Status: DC | PRN

## 2018-08-01 MED ORDER — CEFAZOLIN SODIUM-DEXTROSE 2-4 GM/100ML-% IV SOLN
2.0000 g | Freq: Three times a day (TID) | INTRAVENOUS | Status: DC
  Administered 2018-08-01: 2 g via INTRAVENOUS
  Filled 2018-08-01 (×3): qty 100

## 2018-08-01 MED ORDER — FENTANYL 2.5 MCG/ML W/ROPIVACAINE 0.15% IN NS 100 ML EPIDURAL (ARMC)
12.0000 mL/h | EPIDURAL | Status: DC
  Administered 2018-08-01: 250 ug via EPIDURAL

## 2018-08-01 MED ORDER — LIDOCAINE-EPINEPHRINE (PF) 1.5 %-1:200000 IJ SOLN
INTRAMUSCULAR | Status: DC | PRN
  Administered 2018-08-01: 3 mL via EPIDURAL

## 2018-08-01 MED ORDER — IBUPROFEN 800 MG PO TABS
800.0000 mg | ORAL_TABLET | Freq: Four times a day (QID) | ORAL | Status: DC
  Administered 2018-08-02 – 2018-08-03 (×6): 800 mg via ORAL
  Filled 2018-08-01 (×6): qty 1

## 2018-08-01 MED ORDER — BUPIVACAINE HCL (PF) 0.25 % IJ SOLN
INTRAMUSCULAR | Status: DC | PRN
  Administered 2018-08-01 (×2): 5 mL via EPIDURAL

## 2018-08-01 MED ORDER — COCONUT OIL OIL
1.0000 "application " | TOPICAL_OIL | Status: DC | PRN
  Filled 2018-08-01: qty 120

## 2018-08-01 MED ORDER — MISOPROSTOL 25 MCG QUARTER TABLET
50.0000 ug | ORAL_TABLET | ORAL | Status: DC | PRN
  Administered 2018-08-01: 50 ug via VAGINAL
  Filled 2018-08-01: qty 1

## 2018-08-01 MED ORDER — FAMOTIDINE 20 MG PO TABS
20.0000 mg | ORAL_TABLET | Freq: Two times a day (BID) | ORAL | Status: DC
  Administered 2018-08-01 – 2018-08-02 (×2): 20 mg via ORAL
  Filled 2018-08-01 (×3): qty 1

## 2018-08-01 MED ORDER — SIMETHICONE 80 MG PO CHEW: 80.0000 mg | CHEWABLE_TABLET | ORAL | Status: DC | PRN

## 2018-08-01 MED ORDER — SENNOSIDES-DOCUSATE SODIUM 8.6-50 MG PO TABS
2.0000 | ORAL_TABLET | ORAL | Status: DC
  Administered 2018-08-03: 2 via ORAL
  Filled 2018-08-01 (×2): qty 2

## 2018-08-01 MED ORDER — OXYTOCIN BOLUS FROM INFUSION
500.0000 mL | Freq: Once | INTRAVENOUS | Status: AC
  Administered 2018-08-01: 500 mL via INTRAVENOUS

## 2018-08-01 MED ORDER — EPHEDRINE 5 MG/ML INJ
10.0000 mg | INTRAVENOUS | Status: DC | PRN
  Filled 2018-08-01: qty 2

## 2018-08-01 MED ORDER — LACTATED RINGERS IV SOLN: 500.0000 mL | Freq: Once | INTRAVENOUS | Status: DC

## 2018-08-01 MED ORDER — PRENATAL MULTIVITAMIN CH
1.0000 | ORAL_TABLET | Freq: Every day | ORAL | Status: DC
  Administered 2018-08-02 – 2018-08-03 (×2): 1 via ORAL
  Filled 2018-08-01 (×2): qty 1

## 2018-08-01 MED ORDER — OXYTOCIN 10 UNIT/ML IJ SOLN
INTRAMUSCULAR | Status: AC
  Filled 2018-08-01: qty 2

## 2018-08-01 MED ORDER — ACETAMINOPHEN 325 MG PO TABS
650.0000 mg | ORAL_TABLET | ORAL | Status: DC | PRN
  Administered 2018-08-01 (×2): 650 mg via ORAL
  Filled 2018-08-01 (×2): qty 2

## 2018-08-01 MED ORDER — EPHEDRINE 5 MG/ML INJ
INTRAVENOUS | Status: AC
  Administered 2018-08-01: 10 mg via INTRAVENOUS
  Filled 2018-08-01: qty 4

## 2018-08-01 MED ORDER — LIDOCAINE HCL (PF) 1 % IJ SOLN: 30.0000 mL | INTRAMUSCULAR | Status: DC | PRN

## 2018-08-01 MED ORDER — BENZOCAINE-MENTHOL 20-0.5 % EX AERO
INHALATION_SPRAY | CUTANEOUS | Status: AC
  Administered 2018-08-01: 18:00:00
  Filled 2018-08-01: qty 56

## 2018-08-01 MED ORDER — LACTATED RINGERS IV SOLN
500.0000 mL | INTRAVENOUS | Status: DC | PRN
  Administered 2018-08-01 (×3): 500 mL via INTRAVENOUS

## 2018-08-01 MED ORDER — MISOPROSTOL 200 MCG PO TABS
ORAL_TABLET | ORAL | Status: AC
  Administered 2018-08-01: 50 ug via VAGINAL
  Filled 2018-08-01: qty 4

## 2018-08-01 MED ORDER — DIPHENHYDRAMINE HCL 50 MG/ML IJ SOLN: 12.5000 mg | INTRAMUSCULAR | Status: DC | PRN

## 2018-08-01 MED ORDER — TERBUTALINE SULFATE 1 MG/ML IJ SOLN: 0.2500 mg | Freq: Once | INTRAMUSCULAR | Status: DC | PRN

## 2018-08-01 MED ORDER — FENTANYL 2.5 MCG/ML W/ROPIVACAINE 0.15% IN NS 100 ML EPIDURAL (ARMC)
EPIDURAL | Status: AC
  Filled 2018-08-01: qty 100

## 2018-08-01 MED ORDER — ACETAMINOPHEN 325 MG PO TABS
650.0000 mg | ORAL_TABLET | ORAL | Status: DC | PRN
  Administered 2018-08-01 – 2018-08-03 (×4): 650 mg via ORAL
  Filled 2018-08-01 (×4): qty 2

## 2018-08-01 MED ORDER — FENTANYL 2.5 MCG/ML W/ROPIVACAINE 0.15% IN NS 100 ML EPIDURAL (ARMC)
EPIDURAL | Status: AC
  Administered 2018-08-01: 250 ug via EPIDURAL
  Filled 2018-08-01: qty 100

## 2018-08-01 MED ORDER — ONDANSETRON HCL 4 MG/2ML IJ SOLN: 4.0000 mg | INTRAMUSCULAR | Status: DC | PRN

## 2018-08-01 MED ORDER — CEFAZOLIN SODIUM-DEXTROSE 1-4 GM/50ML-% IV SOLN
1.0000 g | Freq: Three times a day (TID) | INTRAVENOUS | Status: DC
  Administered 2018-08-01: 1 g via INTRAVENOUS
  Filled 2018-08-01 (×4): qty 50

## 2018-08-01 MED ORDER — BUTORPHANOL TARTRATE 1 MG/ML IJ SOLN
1.0000 mg | INTRAMUSCULAR | Status: DC | PRN
  Administered 2018-08-01: 1 mg via INTRAVENOUS
  Filled 2018-08-01: qty 1

## 2018-08-01 MED ORDER — WITCH HAZEL-GLYCERIN EX PADS: 1.0000 "application " | MEDICATED_PAD | CUTANEOUS | Status: DC | PRN

## 2018-08-01 MED ORDER — ZOLPIDEM TARTRATE 5 MG PO TABS: 5.0000 mg | ORAL_TABLET | Freq: Every evening | ORAL | Status: DC | PRN

## 2018-08-01 MED ORDER — SERTRALINE HCL 25 MG PO TABS
50.0000 mg | ORAL_TABLET | Freq: Every day | ORAL | Status: DC
  Administered 2018-08-01 – 2018-08-02 (×2): 50 mg via ORAL
  Filled 2018-08-01 (×3): qty 2

## 2018-08-01 MED ORDER — SOD CITRATE-CITRIC ACID 500-334 MG/5ML PO SOLN: 30.0000 mL | ORAL | Status: DC | PRN

## 2018-08-01 MED ORDER — ONDANSETRON HCL 4 MG PO TABS: 4.0000 mg | ORAL_TABLET | ORAL | Status: DC | PRN

## 2018-08-01 MED ORDER — ONDANSETRON HCL 4 MG/2ML IJ SOLN: 4.0000 mg | Freq: Four times a day (QID) | INTRAMUSCULAR | Status: DC | PRN

## 2018-08-01 MED ORDER — DIBUCAINE 1 % RE OINT: 1.0000 "application " | TOPICAL_OINTMENT | RECTAL | Status: DC | PRN

## 2018-08-01 MED ORDER — LIDOCAINE HCL (PF) 1 % IJ SOLN
INTRAMUSCULAR | Status: AC
  Filled 2018-08-01: qty 30

## 2018-08-01 NOTE — Anesthesia Preprocedure Evaluation (Signed)
Anesthesia Evaluation  Patient identified by MRN, date of birth, ID band Patient awake    Reviewed: Allergy & Precautions, H&P , NPO status , Patient's Chart, lab work & pertinent test results  History of Anesthesia Complications Negative for: history of anesthetic complications  Airway Mallampati: II       Dental no notable dental hx.    Pulmonary former smoker,           Cardiovascular      Neuro/Psych PSYCHIATRIC DISORDERS Anxiety Depression    GI/Hepatic   Endo/Other    Renal/GU      Musculoskeletal   Abdominal   Peds  Hematology   Anesthesia Other Findings   Reproductive/Obstetrics (+) Pregnancy                             Anesthesia Physical Anesthesia Plan  ASA: II  Anesthesia Plan: Epidural   Post-op Pain Management:    Induction:   PONV Risk Score and Plan:   Airway Management Planned:   Additional Equipment:   Intra-op Plan:   Post-operative Plan:   Informed Consent:   Plan Discussed with: Anesthesiologist  Anesthesia Plan Comments:         Anesthesia Quick Evaluation

## 2018-08-01 NOTE — Anesthesia Procedure Notes (Signed)
Epidural Patient location during procedure: OB Start time: 08/01/2018 9:19 AM End time: 08/01/2018 9:37 AM  Staffing Anesthesiologist: Naomie Dean, MD Resident/CRNA: Irving Burton, CRNA Performed: resident/CRNA   Preanesthetic Checklist Completed: patient identified, site marked, surgical consent, pre-op evaluation, IV checked, risks and benefits discussed and monitors and equipment checked  Epidural Patient position: sitting Prep: ChloraPrep Patient monitoring: heart rate, blood pressure and continuous pulse ox Approach: midline Location: L2-L3 Injection technique: LOR air  Needle:  Needle type: Tuohy  Needle gauge: 17 G Needle length: 9 cm Needle insertion depth: 8 cm Catheter type: closed end flexible Catheter size: 19 Gauge Catheter at skin depth: 13 cm Test dose: negative and 1.5% lidocaine with Epi 1:200 K  Assessment Events: blood not aspirated, injection not painful, no injection resistance, negative IV test and no paresthesia  Additional Notes Reason for block:procedure for pain

## 2018-08-01 NOTE — H&P (Signed)
History and Physical   HPI  Barbara [name] is a 27 y.o. G2P0010 at [redacted]w[redacted]d Estimated Date of Delivery: 08/03/18 who is being admitted for  Increased BMI induction.  OB History  OB History  Gravida Para Term Preterm AB Living  2 0 0 0 1 0  SAB TAB Ectopic Multiple Live Births  0 1 0 0 0    # Outcome Date GA Lbr Len/2nd Weight Sex Delivery Anes PTL Lv  2 Current           1 TAB             PROBLEM LIST  Pregnancy complications or risks: Patient Active Problem List   Diagnosis Date Noted  . Increased BMI 08/01/2018  . Labor and delivery indication for care or intervention 07/31/2018  . Indication for care in labor or delivery 07/15/2018  . Excessive weight gain during pregnancy in third trimester 05/30/2018  . Obesity in pregnancy 02/22/2018  . Supervision of normal pregnancy 12/21/2017  . Anxiety and depression 01/26/2017    Prenatal labs and studies: ABO, Rh: --/--/O POS (10/28 0736) Antibody: NEG (10/28 0736) Rubella: 2.62 (03/14 1547) RPR: Non Reactive (08/05 0949)  HBsAg: Negative (03/14 1547)  HIV: Non Reactive (03/14 1547)  ZOX:WRUEAVWU (10/01 9811)   Past Medical History:  Diagnosis Date  . Anxiety   . Depression   . History of Papanicolaou smear of cervix 11/06/14; 02/06/16   NEG-CT POS; NEG - CT/GC/TR NEG;  . STD (sexually transmitted disease)    chlamydia     Past Surgical History:  Procedure Laterality Date  . elective abortion  11/2013   [facility]     Medications    Current Discharge Medication List    CONTINUE these medications which have NOT CHANGED   Details  Prenatal Vit-Fe Fumarate-FA (PRENATAL MULTIVITAMIN) TABS tablet Take 1 tablet by mouth daily at 12 noon.    sertraline (ZOLOFT) 50 MG tablet Take 1 tablet (50 mg total) by mouth daily. Qty: 90 tablet, Refills: 0   Associated Diagnoses: Anxiety and depression    acetaminophen (TYLENOL 8 HOUR) 650 MG CR tablet Take 1 tablet (650 mg total) by mouth every 8  (eight) hours as needed for pain. Qty: 30 tablet, Refills: 1    calcium citrate (CALCITRATE - DOSED IN MG ELEMENTAL CALCIUM) 950 MG tablet Take 200 mg of elemental calcium by mouth daily.    ranitidine (ZANTAC) 150 MG capsule Take 150 mg by mouth 2 (two) times daily.    simethicone (MYLICON) 125 MG chewable tablet Chew 125 mg by mouth every 6 (six) hours as needed for flatulence.         Allergies  Patient has no known allergies.  Review of Systems  Pertinent items are noted in HPI.  Physical Exam  BP (!) 89/43   Pulse (!) 110   Temp 98.2 F (36.8 C) (Oral)   Resp 18   Ht 5\' 6"  (1.676 m)   Wt 117 kg   LMP 10/29/2017 (Approximate)   SpO2 100%   BMI 41.64 kg/m   Lungs:  CTA B Cardio: RRR without M/R/G Abd: Soft, gravid, NT Presentation: cephalic EXT: No C/C/ 1+ Edema DTRs: 2+ B CERVIX: Dilation: 3 Effacement (%): 80 Cervical Position: Posterior Station: -2 Exam by:: Dr.Evans   See Prenatal records for more detailed PE.     FHR:  Variability: Good {> 6 bpm)  Toco: Uterine Contractions: occ   Test Results  Results for orders placed or performed during the hospital encounter of 08/01/18 (from the past 24 hour(s))  CBC     Status: Abnormal   Collection Time: 08/01/18  6:44 AM  Result Value Ref Range   WBC 16.0 (H) 4.0 - 10.5 K/uL   RBC 3.84 (L) 3.87 - 5.11 MIL/uL   Hemoglobin 11.5 (L) 12.0 - 15.0 g/dL   HCT 30.8 (L) 65.7 - 84.6 %   MCV 90.1 80.0 - 100.0 fL   MCH 29.9 26.0 - 34.0 pg   MCHC 33.2 30.0 - 36.0 g/dL   RDW 96.2 95.2 - 84.1 %   Platelets 209 150 - 400 K/uL   nRBC 0.0 0.0 - 0.2 %  Type and screen     Status: None   Collection Time: 08/01/18  7:36 AM  Result Value Ref Range   ABO/RH(D) O POS    Antibody Screen NEG    Sample Expiration      08/04/2018 Performed at [facility], [address] [place], [place] [address]    Group B Strep positive   AROM - performed - IUPC placed - Misoprostol placed Assessment    G2P0010 at [redacted]w[redacted]d Estimated Date of Delivery: 08/03/18  The fetus is reassuring.    Patient Active Problem List   Diagnosis Date Noted  . Increased BMI 08/01/2018  . Labor and delivery indication for care or intervention 07/31/2018  . Indication for care in labor or delivery 07/15/2018  . Excessive weight gain during pregnancy in third trimester 05/30/2018  . Obesity in pregnancy 02/22/2018  . Supervision of normal pregnancy 12/21/2017  . Anxiety and depression 01/26/2017    Plan  1. Admit to L&D :    2. EFM: -- Category 1 3. Epidural if desired.  Stadol for IV pain until epidural requested. 4. Admission labs  5.  GBS - Abx  Elonda Husky, M.D. 08/01/2018 10:27 AM

## 2018-08-01 NOTE — Progress Notes (Signed)
Intrapartum Progress Note  S: Patient comfortable with epidural  O: Blood pressure 105/66, pulse (!) 124, temperature 98.6 F (37 C), temperature source Oral, resp. rate 18, height 5\' 6"  (1.676 m), weight 117 kg, last menstrual period 10/29/2017, SpO2 100 %. Gen App: NAD, comfortable Abdomen: soft, gravid FHT: baseline 135 bpm.  Accels present.  Decels absent. moderate in degree variability.   Tocometer: contractions q 3-4 minutes Cervix: 6/80-90/-1 Extremities: Nontender, no edema.  Pitocin: None  Labs: No new labs   Assessment:  1: SIUP at [redacted]w[redacted]d 2. Morbid obesity 3. GBS +  Plan:  1. Continue with scheduled IOL for morbid obesity 2. Anticipate vaginal delivery 3. Continue Ancef 2g for GBS prophylaxis  Hildred Laser, MD 08/01/2018 12:04 PM

## 2018-08-01 NOTE — Progress Notes (Signed)
Intrapartum Progress Note  S: Patient denies complaints  O: Blood pressure 102/67, pulse (!) 124, temperature 98.4 F (36.9 C), temperature source Oral, resp. rate 18, height 5\' 6"  (1.676 m), weight 117 kg, last menstrual period 10/29/2017, SpO2 100 %. Gen App: NAD, comfortable Abdomen: soft, gravid FHT: baseline 185 bpm.  Accels present.  Decels absent. moderate in degree variability.   Tocometer: contractions q 1-5 minutes Cervix: 7/90/-1 Extremities: Nontender, no edema.  Pitocin: 12 mIU  Labs: No new labs  Assessment:  1: SIUP at [redacted]w[redacted]d 2. Morbid obesity 3. GBS + 4. Maternal and fetal tachycardia  Plan:  1. Continue with scheduled IOL for morbid obesity.  Patient currently on Pitocin. Continue augmentation . 2. Anticipate vaginal delivery 3. Continue Ancef 1g for GBS prophylaxis 4. Maternal and fetal tachycardia present. No maternal temperature noted. Continue to monitor. Started on IVF bolus. Continue to monitor for s/s of chorioamnionitis.   Hildred Laser, MD 08/01/2018 1:37 PM

## 2018-08-01 NOTE — Discharge Summary (Signed)
    L&D OB Triage Note  SUBJECTIVE Barbara [name] is a 27 y.o. G2P0010 female at [redacted]w[redacted]d, EDD Estimated Date of Delivery: 08/03/18 who presented to triage with complaints of contractions.   OB History  Gravida Para Term Preterm AB Living  2 0 0 0 1 0  SAB TAB Ectopic Multiple Live Births  0 1 0 0 0    # Outcome Date GA Lbr Len/2nd Weight Sex Delivery Anes PTL Lv  2 Current           1 TAB             Medications Prior to Admission  Medication Sig Dispense Refill Last Dose  . Prenatal Vit-Fe Fumarate-FA (PRENATAL MULTIVITAMIN) TABS tablet Take 1 tablet by mouth daily at 12 noon.   Past Week at Unknown time  . sertraline (ZOLOFT) 50 MG tablet Take 1 tablet (50 mg total) by mouth daily. 90 tablet 0 Past Week at Unknown time  . acetaminophen (TYLENOL 8 HOUR) 650 MG CR tablet Take 1 tablet (650 mg total) by mouth every 8 (eight) hours as needed for pain. (Patient not taking: Reported on 07/31/2018) 30 tablet 1 Not Taking at Unknown time  . calcium citrate (CALCITRATE - DOSED IN MG ELEMENTAL CALCIUM) 950 MG tablet Take 200 mg of elemental calcium by mouth daily.   Not Taking at Unknown time  . ranitidine (ZANTAC) 150 MG capsule Take 150 mg by mouth 2 (two) times daily.   Not Taking at Unknown time  . simethicone (MYLICON) 125 MG chewable tablet Chew 125 mg by mouth every 6 (six) hours as needed for flatulence.   Not Taking at Unknown time     OBJECTIVE  Nursing Evaluation:   BP 114/67 (BP Location: Left Arm)   Pulse 89   Temp 97.9 F (36.6 C) (Oral)   Resp 20   Ht 5\' 3"  (1.6 m)   Wt 117 kg   LMP 10/29/2017 (Approximate)   BMI 45.70 kg/m    Findings:   Irregular contractions.  No evidence of ROM.  Patient not in labor. NST was performed and has been reviewed by me.  NST INTERPRETATION: Category I  Mode: External Baseline Rate (A): 135 bpm Variability: Moderate Accelerations: 15 x 15 Decelerations: None     Contraction Frequency (min): 3-4.5  ASSESSMENT Impression:  1.   Pregnancy:  G2P0010 at [redacted]w[redacted]d , EDD Estimated Date of Delivery: 08/03/18 2.  NST:  Category I  PLAN 1. Reassurance given 2. Discharge home with standard labor precautions given to return to L&D or call the office for problems. 3. Continue routine prenatal care.

## 2018-08-02 LAB — CBC
HEMATOCRIT: 33.5 % — AB (ref 36.0–46.0)
Hemoglobin: 10.8 g/dL — ABNORMAL LOW (ref 12.0–15.0)
MCH: 29.5 pg (ref 26.0–34.0)
MCHC: 32.2 g/dL (ref 30.0–36.0)
MCV: 91.5 fL (ref 80.0–100.0)
NRBC: 0 % (ref 0.0–0.2)
Platelets: 195 10*3/uL (ref 150–400)
RBC: 3.66 MIL/uL — ABNORMAL LOW (ref 3.87–5.11)
RDW: 12.7 % (ref 11.5–15.5)
WBC: 17.1 10*3/uL — AB (ref 4.0–10.5)

## 2018-08-02 LAB — RPR: RPR Ser Ql: NONREACTIVE

## 2018-08-02 NOTE — Lactation Note (Signed)
This note was copied from a baby's chart. Lactation Consultation Note  Patient Name: Barbara [name] Today's Date: 08/02/2018 Reason for consult: Initial assessment  During Lactation Rounds, Mom had baby at breast and had just nursed 10 minutes on right breast earlier (she says she heard a lot of swallows), but then trying to get her to latch to left breast. Left Nipple flattens when areola is compressed. Mom does a good job rolling/stimulating nipple to evert. I also gave her shells for flat nipples. She just placed them in her bra after trying to get baby latched on, but could not. Baby stopped cueing and Mom wanted rest. She then said she may formula feed at night so "baby would sleep better". After agreeing that we will support her feeding choices, I educated her about differences between formula and her milk and her milk supply if delayed feeds, etc. I also let her know the baby may or may not sleep much longer if formula fed at this point. She did admit their are dairy allergies in her family, so she planned to give soy. I informed her that product has its own potential issues that she may want to discuss with her MD. I also let her know that donor milk is an option if there is a medical need. Right now, it seems baby is thriving on her milk and content. I also gave her a breast pump as requested so she can work on stimulating left breast and evert nipple when [name] does not nurse well there.  Maternal Data    Feeding Feeding Type: Breast Fed  LATCH Score                   Interventions    Lactation Tools Discussed/Used     Consult Status      Sunday Corn 08/02/2018, 10:53 AM

## 2018-08-02 NOTE — Progress Notes (Signed)
Post Partum Day # 1, s/p SVD.  Subjective: no complaints, up ad lib, voiding and tolerating PO  Objective: Temp:  [97.6 F (36.4 C)-101 F (38.3 C)] 98.1 F (36.7 C) (10/29 0428) Pulse Rate:  [75-125] 75 (10/29 0428) Resp:  [14-18] 18 (10/29 0428) BP: (57-113)/(38-77) 96/62 (10/29 0428) SpO2:  [97 %-100 %] 99 % (10/29 0428)  Physical Exam:  General: alert and no distress  Lungs: clear to auscultation bilaterally Breasts: normal appearance, no masses or tenderness Heart: regular rate and rhythm, S1, S2 normal, no murmur, click, rub or gallop Abdomen: soft, non-tender; bowel sounds normal; no masses,  no organomegaly Pelvis: Lochia: appropriate, Uterine Fundus: firm Extremities: DVT Evaluation: No evidence of DVT seen on physical exam. Negative Homan's sign. No cords or calf tenderness. No significant calf/ankle edema.  Recent Labs    08/01/18 0644 08/02/18 0559  HGB 11.5* 10.8*  HCT 34.6* 33.5*    Assessment/Plan: Doing well postpartum Breastfeeding ,Lactation consult  Contraception undecided.  To discuss further at postpartum visit. Mild anemia of pregnancy, can treat with PO iron in PNV.  Plan for discharge tomorrow   LOS: 1 day   Hildred Laser, MD [facility] 08/02/2018 8:08 AM

## 2018-08-02 NOTE — Anesthesia Postprocedure Evaluation (Signed)
Anesthesia Post Note  Patient: Barbara [name]  Procedure(s) Performed: AN AD HOC LABOR EPIDURAL  Patient location during evaluation: Mother Baby Anesthesia Type: Epidural Level of consciousness: awake and alert Pain management: pain level controlled Vital Signs Assessment: post-procedure vital signs reviewed and stable Respiratory status: spontaneous breathing, nonlabored ventilation and respiratory function stable Cardiovascular status: stable Postop Assessment: no headache, no backache and epidural receding Anesthetic complications: no     Last Vitals:  Vitals:   08/02/18 0006 08/02/18 0428  BP: 93/62 96/62  Pulse: 94 75  Resp: 14 18  Temp: 37.1 C 36.7 C  SpO2: 98% 99%    Last Pain:  Vitals:   08/02/18 0400  TempSrc:   PainSc: Asleep                 Rica Mast

## 2018-08-03 MED ORDER — DOCUSATE SODIUM 100 MG PO CAPS: 100.0000 mg | ORAL_CAPSULE | Freq: Two times a day (BID) | ORAL | 2 refills | Status: DC | PRN

## 2018-08-03 MED ORDER — IBUPROFEN 800 MG PO TABS: 800.0000 mg | ORAL_TABLET | Freq: Three times a day (TID) | ORAL | 1 refills | Status: DC | PRN

## 2018-08-03 NOTE — Discharge Summary (Signed)
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 yo G2P1011 at [redacted]w[redacted]d was admitted to the hospital 08/01/2018 for induction of labor.  Indication for induction: Morbid obesity.  Patient had an uncomplicated labor course as follows: Membrane Rupture Time/Date: 7:41 AM ,08/01/2018   Intrapartum Procedures: Episiotomy: None [1]                                         Lacerations:  2nd degree [3]  Patient had delivery of a Viable infant.  Information for the patient's newborn:  Lynnette Caffey Girl Elaysia [[id number]]  Delivery Method: Vag-Spont   08/01/2018  Details of delivery can be found in separate delivery note.  Patient had a routine postpartum course. Patient is discharged home 08/03/18.  Physical exam  Vitals:   08/02/18 0855 08/02/18 0945 08/02/18 1613 08/02/18 2317  BP: (!) 88/53 (!) 93/53 (!) 116/57 (!) 109/58  Pulse: 83  71 86  Resp: 18  20 20   Temp: 98.2 F (36.8 C)  98.5 F (36.9 C) 97.8 F (36.6 C)  TempSrc: Oral  Oral Oral  SpO2: 98%  97% 100%  Weight:      Height:       General: alert and no distress Lochia: appropriate Uterine Fundus: firm Incision: N/A DVT Evaluation: No evidence of DVT seen on physical exam. Negative Homan's sign. No cords or calf tenderness. No significant calf/ankle  edema. Labs: Lab Results  Component Value Date   WBC 17.1 (H) 08/02/2018   HGB 10.8 (L) 08/02/2018   HCT 33.5 (L) 08/02/2018   MCV 91.5 08/02/2018   PLT 195 08/02/2018   CMP Latest Ref Rng & Units 12/03/2013  Glucose 65 - 99 mg/dL 93  BUN 7 - 18 mg/dL 17  Creatinine 7.82 - 9.56 mg/dL 2.13  Sodium 086 - 578 mmol/L 140  Potassium 3.5 - 5.1 mmol/L 3.9  Chloride 98 - 107 mmol/L 112(H)  CO2 21 - 32 mmol/L 20(L)  Calcium 8.5 - 10.1 mg/dL 8.9  Total Protein 6.4 - 8.2 g/dL 7.8  Total Bilirubin 0.2 - 1.0 mg/dL 4.6(N)  Alkaline Phos Unit/L 63  AST 15 - 37 Unit/L 11(L)  ALT 12 - 78 U/L 22    Discharge instruction: per After Visit Summary and "Baby and Me Booklet".  After visit meds:  Allergies as of 08/03/2018   No Known Allergies     Medication List    TAKE these medications   acetaminophen 650 MG CR tablet Commonly known as:  TYLENOL Take 1 tablet (650 mg total) by mouth every 8 (eight) hours as needed for pain.   calcium citrate 950 MG tablet Commonly known as:  CALCITRATE - dosed in mg elemental calcium Take 200 mg of elemental calcium by mouth daily.   ibuprofen 800 MG tablet Commonly known as:  ADVIL,MOTRIN Take 1 tablet (800 mg total) by mouth every 8 (eight) hours as needed.   prenatal multivitamin Tabs tablet Take 1 tablet by mouth daily at 12 noon.   ranitidine 150 MG capsule Commonly known as:  ZANTAC Take 150 mg by mouth 2 (two) times daily.   sertraline 50 MG tablet Commonly known as:  ZOLOFT Take 1 tablet (50 mg total) by mouth daily.   simethicone 125 MG chewable tablet Commonly known as:  MYLICON Chew 125 mg by mouth every 6 (six) hours as needed for flatulence.       Diet: routine diet  Activity: Advance as tolerated. Pelvic rest for 6 weeks.   Outpatient follow up:2 weeks Follow up Appt:No future appointments. Follow up Visit:No follow-ups on file.  Postpartum contraception: Undecided  Newborn Data: Live born female  Birth Weight: 8  lb 6 oz (3800 g) APGAR: 7, 9  Newborn Delivery   Birth date/time:  08/01/2018 17:38:00 Delivery type:  Vaginal, Spontaneous     Baby Feeding: Breast Disposition:home with mother   08/03/2018 Hildred Laser, MD

## 2018-08-03 NOTE — Lactation Note (Signed)
This note was copied from a baby's chart. Lactation Consultation Note  Patient Name: Barbara [name] Today's Date: 08/03/2018     Maternal Data    Feeding    LATCH Score                   Interventions    Lactation Tools Discussed/Used     Consult Status  Mother states that breastfeeding is going really well now and had just breast-fed infant for 15 minutes on both sides. She also states that her left nipple still inverts but using the shells and placing infant in the football position helps. LC gave mother info on the Mom's express support group and the lactation outpatient number if needed after discharge. Mom denies any questions or concerns at this time.    Arlyss Gandy 08/03/2018, 10:05 AM

## 2018-08-03 NOTE — Discharge Instructions (Signed)
Please call your doctor or return to the ER if you experience any chest pains, shortness of breath, dizziness, visual changes, fever greater than 101, any heavy bleeding (saturating more than 1 pad per hour), large clots, or foul smelling discharge, any worsening abdominal pain and cramping that is not controlled by pain medication, or any signs of postpartum depression. No tampons, enemas, douches, or sexual intercourse for 6 weeks. Also avoid tub baths, hot tubs, or swimming for 6 weeks.  °

## 2018-08-03 NOTE — Progress Notes (Signed)
Discharge order received from doctor. Reviewed discharge instructions and prescriptions with patient and answered all questions. Follow up appointment instructions given. Patient verbalized understanding. ID bands checked. Patient discharged home with infant via wheelchair by nursing/auxillary.    Antwoin Lackey Garner, RN  

## 2018-08-08 ENCOUNTER — Telehealth: Payer: Self-pay | Admitting: Obstetrics and Gynecology

## 2018-08-08 NOTE — Telephone Encounter (Signed)
Pt called questioning congestion and sore throat post partem. Pt has no fever and does not feel ill. Informed pt that congestion and sore throat maybe a symptom of the common cold. Advised pt to monitor temperature and symptoms, practice hand hygiene and to address her PCP if condition persists or worsens. Pt voiced understanding.

## 2018-08-08 NOTE — Telephone Encounter (Signed)
Patient called stating she is congested and has a sore throat. She would like a call back. Thanks

## 2018-08-17 ENCOUNTER — Encounter: Payer: Self-pay | Admitting: Obstetrics and Gynecology

## 2018-08-17 ENCOUNTER — Ambulatory Visit (INDEPENDENT_AMBULATORY_CARE_PROVIDER_SITE_OTHER): Payer: BC Managed Care – PPO | Admitting: Obstetrics and Gynecology

## 2018-08-17 VITALS — BP 113/81 | HR 96 | Ht 66.0 in | Wt 240.1 lb

## 2018-08-17 DIAGNOSIS — F329 Major depressive disorder, single episode, unspecified: Secondary | ICD-10-CM

## 2018-08-17 DIAGNOSIS — Z3009 Encounter for other general counseling and advice on contraception: Secondary | ICD-10-CM

## 2018-08-17 DIAGNOSIS — O99340 Other mental disorders complicating pregnancy, unspecified trimester: Secondary | ICD-10-CM

## 2018-08-17 DIAGNOSIS — O9081 Anemia of the puerperium: Secondary | ICD-10-CM

## 2018-08-17 NOTE — Progress Notes (Signed)
PT is present today for her postpartum visit. Pt stated that she is breastfeeding and have not had sexually intercourse recently. Pt stated that she would like to get birth control, but is unsure about which type. EPDS= 13.  Pt stated that her vaginal area is sore. Pt also stated that she a cough that will not go away lightheaded, and she has coughed up green mucus. No other complaints.

## 2018-08-17 NOTE — Progress Notes (Signed)
   OBSTETRICS POSTPARTUM CLINIC PROGRESS NOTE  Subjective:     Barbara [name] is a 27 y.o. 542P1011 female who presents for a postpartum visit. She is 2 weeks postpartum following a spontaneous vaginal delivery. I have fully reviewed the prenatal and intrapartum course. The delivery was at 39.5 gestational weeks.  Anesthesia: epidural. Postpartum course has been well. Baby's course has been well. Baby is feeding by breast. Bleeding: patient has not resumed menses, with No LMP recorded. Contraception method desired is undecided. Postpartum depression screening: positive (EPDS screen = 13).  Patient does have a history of depression in pregnancy, currently on Zoloft 50 mg.   The following portions of the patient's history were reviewed and updated as appropriate: allergies, current medications, past family history, past medical history, past social history, past surgical history and problem list.  Review of Systems Pertinent items noted in HPI and remainder of comprehensive ROS otherwise negative.   Objective:    BP 113/81   Pulse 96   Ht 5\' 6"  (1.676 m)   Wt 240 lb 1.6 oz (108.9 kg)   Breastfeeding? Yes   BMI 38.75 kg/m   General:  alert and no distress   Breasts:  inspection negative, no nipple discharge or bleeding, no masses or nodularity palpable  Lungs: clear to auscultation bilaterally  Heart:  regular rate and rhythm, S1, S2 normal, no murmur, click, rub or gallop  Abdomen: soft, non-tender; bowel sounds normal; no masses,  no organomegaly.    Pelvis: deferred         Labs:  Lab Results  Component Value Date   HGB 10.8 (L) 08/02/2018     Assessment:    Routine postpartum exam.     H/o depression in pregnancy Anemia postpartum Contraception counseling     Plan:    1. Contraception: Reviewed all forms of birth control options available including abstinence; fertility period awareness methods; over the counter/barrier methods; hormonal contraceptive medication  including pill, patch, ring, injection,contraceptive implant; hormonal and nonhormonal IUDs; permanent sterilization options including vasectomy and the various tubal sterilization modalities were not discussed. Risks and benefits reviewed.  Questions were answered. Patient considering Rutha BouchardKyleena IUD. Information was given to patient to review.  2. 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  Current EPDS score is 13. Patient overall notes that she feels she is coping well, has good support. Declines increase in medication, counseling at this time.  Continued to encourage patient to f/u if symptoms worsen.  3. Mild anemia postpartum, asymptomatic.  Taking PNV with iron.  4. Follow up in: 4 weeks for final postpartum visit and Kyleena insertion.   Barbara Laserherry, Barbara Luby, MD [facility]

## 2018-09-14 ENCOUNTER — Encounter: Payer: Self-pay | Admitting: Obstetrics and Gynecology

## 2018-09-14 ENCOUNTER — Ambulatory Visit (INDEPENDENT_AMBULATORY_CARE_PROVIDER_SITE_OTHER): Payer: BC Managed Care – PPO | Admitting: Obstetrics and Gynecology

## 2018-09-14 DIAGNOSIS — Z3202 Encounter for pregnancy test, result negative: Secondary | ICD-10-CM | POA: Diagnosis not present

## 2018-09-14 DIAGNOSIS — Z30430 Encounter for insertion of intrauterine contraceptive device: Secondary | ICD-10-CM | POA: Diagnosis not present

## 2018-09-14 DIAGNOSIS — N898 Other specified noninflammatory disorders of vagina: Secondary | ICD-10-CM

## 2018-09-14 DIAGNOSIS — Z1389 Encounter for screening for other disorder: Secondary | ICD-10-CM

## 2018-09-14 DIAGNOSIS — Z8659 Personal history of other mental and behavioral disorders: Secondary | ICD-10-CM

## 2018-09-14 DIAGNOSIS — J069 Acute upper respiratory infection, unspecified: Secondary | ICD-10-CM

## 2018-09-14 LAB — POCT URINE PREGNANCY: Preg Test, Ur: NEGATIVE

## 2018-09-14 MED ORDER — AZITHROMYCIN 250 MG PO TABS: ORAL_TABLET | ORAL | 0 refills | Status: DC

## 2018-09-14 NOTE — Progress Notes (Signed)
OBSTETRICS POSTPARTUM CLINIC PROGRESS NOTE  Subjective:     Barbara [name] is a 27 y.o. 452P1011 female who presents for a postpartum visit. She is 6 weeks postpartum following a spontaneous vaginal delivery. I have fully reviewed the prenatal and intrapartum course. The delivery was at 39.5 gestational weeks.  Anesthesia: epidural. Postpartum course has been well. Baby's course has been well. Baby is feeding by breast. Bleeding: patient has not resumed menses, with No LMP recorded. Bowel function is normal. Bladder function is normal. Patient is sexually active. Last intercourse was 1 week ago, protected (used condom). Contraception method desired is IUD. Postpartum depression screening: positive (EPDS score is 14).  The following portions of the patient's history were reviewed and updated as appropriate: allergies, current medications, past family history, past medical history, past social history, past surgical history and problem list.  Review of Systems A comprehensive review of systems was negative except for: Respiratory: positive for chronic productive cough, has had for 4 weeks. Has taken Mucinex, Tylenol Cold, Sudafed, and Robitussin with no resolution Genitourinary: positive for vaginal dryness.  Has tried coconut oil and KY gel with no relief.    Skin: itching in the lower abdominal area with small area of redness.   Objective:    BP 96/66   Pulse 97   Ht 5\' 6"  (1.676 m)   Wt 243 lb 6.4 oz (110.4 kg)   Breastfeeding? Yes   BMI 39.29 kg/m   General:  alert and no distress   Breasts:  inspection negative, no nipple discharge or bleeding, no masses or nodularity palpable  Lungs: clear to auscultation bilaterally  Heart:  regular rate and rhythm, S1, S2 normal, no murmur, click, rub or gallop  Abdomen: soft, non-tender; bowel sounds normal; no masses,  no organomegaly.     Vulva:  normal  Vagina: normal vagina, no discharge, exudate, lesion, or erythema  Cervix:  no cervical  motion tenderness and no lesions  Corpus: normal size, contour, position, consistency, mobility, non-tender  Adnexa:  normal adnexa and no mass, fullness, tenderness  Rectal Exam: Not performed.         Labs:  Lab Results  Component Value Date   HGB 10.8 (L) 08/02/2018     Assessment:  .   1. Postpartum care following vaginal delivery 2. Vaginal dryness 3. IUD insertion 4. Skin rash 5. History of depression 6. Chronic URI symptoms  Plan:   1. Contraception: IUD, desires PalauKyleena. Can insert today. See procedure note below.  UPT negative today.  2. Vaginal dryness. Advised on Joe's H20, or Uberlube (given samples).   3. Skin rash, small area of dermatitis near pannus. Advised on hydrocortisone cream and Benadryl cream (she took oral Benadryl once or twice with no relief of itching).  4. Postpartum depression screen is positive, score is 14.  Patient notes that she feels ok, has a good support system. Is currently on Zoloft (has been on during pregnancy as well).  Does not feel that she needs a change in medication (increased dosing) at this time.  Will f/u again at IUD check.  5.  Chronic URI symptoms. Patient has taken several OTC meds with no relief. Will prescribe Z-pack, and advised on Alka Seltzer cold.  If symptoms persist, can prescribe Tussionex (but cautioned use with breastfeeding).  6. Follow up in: 4 weeks for IUD check, or sooner as needed.      GYNECOLOGY OFFICE PROCEDURE NOTE  Taniqua L [name] is a 27 y.o. [id number]  here for [facility] IUD insertion. No GYN concerns.  Last pap smear was on 05/2017 and was normal.  IUD Insertion Procedure Note Patient identified, informed consent performed, consent signed.   Discussed risks of irregular bleeding, cramping, infection, malpositioning or misplacement of the IUD outside the uterus which may require further procedure such as laparoscopy. Urine pregnancy test negative.  Speculum placed in the vagina.  Cervix visualized.   Cleaned with Betadine x 2.  Grasped anteriorly with a single tooth tenaculum.  Uterus sounded to 8 cm.  Kyleena IUD placed per manufacturer's recommendations.  Strings trimmed to 3 cm. Tenaculum was removed, good hemostasis noted.  Patient tolerated procedure well.   Patient was given post-procedure instructions.  She was advised to have backup contraception for one week.  Patient was also asked to check IUD strings periodically and follow up in 4 weeks for IUD check.    Lot #: [id number] Exp: May 2021  Hildred Laser, MD [facility]

## 2018-09-14 NOTE — Patient Instructions (Signed)

## 2018-09-14 NOTE — Progress Notes (Signed)
   PT is present today for her postpartum visit. Pt stated that she is breastfeeding and have attempted to had sexually intercourse once. Pt stated that she would like to get the Mirena for birth control. EPDS= 14.  UPT=negative.  Pt stated that she noticed brown discharge of blood. Pt stated that she is itching in the abd area and chest. Pt still coughing for 6 weeks.

## 2018-09-30 ENCOUNTER — Encounter: Payer: Self-pay | Admitting: Obstetrics and Gynecology

## 2018-09-30 NOTE — Progress Notes (Signed)
NST performed today was reviewed and was found to be reactive.  Continue recommended antenatal testing and prenatal care.   NONSTRESS TEST INTERPRETATION  INDICATIONS: Obesity (morbid, BMI 41)   FHR baseline: 135 bpm RESULTS:Reactive COMMENTS: Uterine irritability present   PLAN: 1. Continue fetal kick counts twice a day. 2. Continue antepartum testing as scheduled-weekly

## 2018-10-12 ENCOUNTER — Encounter: Payer: BC Managed Care – PPO | Admitting: Obstetrics and Gynecology

## 2018-10-20 ENCOUNTER — Encounter: Payer: Self-pay | Admitting: Obstetrics and Gynecology

## 2018-10-20 ENCOUNTER — Ambulatory Visit (INDEPENDENT_AMBULATORY_CARE_PROVIDER_SITE_OTHER): Payer: BC Managed Care – PPO | Admitting: Obstetrics and Gynecology

## 2018-10-20 VITALS — BP 96/56 | HR 80 | Ht 66.0 in | Wt 246.1 lb

## 2018-10-20 DIAGNOSIS — F419 Anxiety disorder, unspecified: Secondary | ICD-10-CM | POA: Diagnosis not present

## 2018-10-20 DIAGNOSIS — Z30431 Encounter for routine checking of intrauterine contraceptive device: Secondary | ICD-10-CM

## 2018-10-20 DIAGNOSIS — F329 Major depressive disorder, single episode, unspecified: Secondary | ICD-10-CM | POA: Diagnosis not present

## 2018-10-20 MED ORDER — SERTRALINE HCL 50 MG PO TABS: 50.0000 mg | ORAL_TABLET | Freq: Every day | ORAL | 3 refills | Status: DC

## 2018-10-20 NOTE — Progress Notes (Signed)
    GYNECOLOGY OFFICE ENCOUNTER NOTE  History:  28 y.o. G2P1011 here today for today for IUD string check; Kyleena  IUD was placed  09/14/2018. No complaints about the IUD, no concerning side effects.  Of note, patient reports that she is having some mood symptoms. Is on Zoloft for a h/o depression, however notes that over the past few weeks she has just been feeling "blah".  Notes that she really does not have any motivation to do anything outside of taking care of her baby.  Importantly, patient does note that she has been inconsistent in taking her meds over the past few weeks, missing several doses each week.    The following portions of the patient's history were reviewed and updated as appropriate: allergies, current medications, past family history, past medical history, past social history, past surgical history and problem list. Last pap smear on 05/2017 was normal, negative HRHPV.  Review of Systems:  Pertinent items are noted in HPI.   Objective:  Physical Exam Blood pressure (!) 96/56, pulse 80, height 5\' 6"  (1.676 m), weight 246 lb 1.6 oz (111.6 kg), currently breastfeeding. CONSTITUTIONAL: Well-developed, well-nourished female in no acute distress.  ABDOMEN: Soft, no distention noted.   PELVIC: Normal appearing external genitalia; normal appearing vaginal mucosa and cervix.  IUD strings visualized, about 3 cm in length outside cervix.  EXTREMITIES: extremities normal, atraumatic, no cyanosis or edema NEUROLOGIC: Grossly normal   Assessment & Plan:  - Patient to keep IUD in place for up to five years; can come in for removal if she desires pregnancy earlier or for or concerning side effects. - Encouraged patient for better compliance of her depression medications. If no improvement after 2 weeks, can increase dose to 75 mg (notes being on dosage as high as 100 mg at one point in time but did not like the way it made her feel).  To notify MD if symptoms do not improve and  schedule a follow up appointment.    Hildred Laser, MD [facility]

## 2018-10-20 NOTE — Progress Notes (Signed)
Pt is present today for IUD check. Pt stated that the IUD is doing well no complaints.

## 2019-03-20 ENCOUNTER — Telehealth: Payer: Self-pay

## 2019-03-20 NOTE — Patient Instructions (Addendum)
Preventive Care 18-39 Years, Female Preventive care refers to lifestyle choices and visits with your health care provider that can promote health and wellness. What does preventive care include?   A yearly physical exam. This is also called an annual well check.  Dental exams once or twice a year.  Routine eye exams. Ask your health care provider how often you should have your eyes checked.  Personal lifestyle choices, including: ? Daily care of your teeth and gums. ? Regular physical activity. ? Eating a healthy diet. ? Avoiding tobacco and drug use. ? Limiting alcohol use. ? Practicing safe sex. ? Taking vitamin and mineral supplements as recommended by your health care provider. What happens during an annual well check? The services and screenings done by your health care provider during your annual well check will depend on your age, overall health, lifestyle risk factors, and family history of disease. Counseling Your health care provider may ask you questions about your:  Alcohol use.  Tobacco use.  Drug use.  Emotional well-being.  Home and relationship well-being.  Sexual activity.  Eating habits.  Work and work Statistician.  Method of birth control.  Menstrual cycle.  Pregnancy history. Screening You may have the following tests or measurements:  Height, weight, and BMI.  Diabetes screening. This is done by checking your blood sugar (glucose) after you have not eaten for a while (fasting).  Blood pressure.  Lipid and cholesterol levels. These may be checked every 5 years starting at age 25.  Skin check.  Hepatitis C blood test.  Hepatitis B blood test.  Sexually transmitted disease (STD) testing.  BRCA-related cancer screening. This may be done if you have a family history of breast, ovarian, tubal, or peritoneal cancers.  Pelvic exam and Pap test. This may be done every 3 years starting at age 59. Starting at age 68, this may be done every 5  years if you have a Pap test in combination with an HPV test. Discuss your test results, treatment options, and if necessary, the need for more tests with your health care provider. Vaccines Your health care provider may recommend certain vaccines, such as:  Influenza vaccine. This is recommended every year.  Tetanus, diphtheria, and acellular pertussis (Tdap, Td) vaccine. You may need a Td booster every 10 years.  Varicella vaccine. You may need this if you have not been vaccinated.  HPV vaccine. If you are 16 or younger, you may need three doses over 6 months.  Measles, mumps, and rubella (MMR) vaccine. You may need at least one dose of MMR. You may also need a second dose.  Pneumococcal 13-valent conjugate (PCV13) vaccine. You may need this if you have certain conditions and were not previously vaccinated.  Pneumococcal polysaccharide (PPSV23) vaccine. You may need one or two doses if you smoke cigarettes or if you have certain conditions.  Meningococcal vaccine. One dose is recommended if you are age 58-21 years and a first-year college student living in a residence hall, or if you have one of several medical conditions. You may also need additional booster doses.  Hepatitis A vaccine. You may need this if you have certain conditions or if you travel or work in places where you may be exposed to hepatitis A.  Hepatitis B vaccine. You may need this if you have certain conditions or if you travel or work in places where you may be exposed to hepatitis B.  Haemophilus influenzae type b (Hib) vaccine. You may need this if you  have certain risk factors. Talk to your health care provider about which screenings and vaccines you need and how often you need them. This information is not intended to replace advice given to you by your health care provider. Make sure you discuss any questions you have with your health care provider. Document Released: 11/17/2001 Document Revised: 05/04/2017  Document Reviewed: 07/23/2015 Elsevier Interactive Patient Education  2019 [employer] Breast self-awareness means:  Knowing how your breasts look.  Knowing how your breasts feel.  Checking your breasts every month for changes.  Telling your doctor if you notice a change in your breasts. Breast self-awareness allows you to notice a breast problem early while it is still small. How to do a breast self-exam One way to learn what is normal for your breasts and to check for changes is to do a breast self-exam. To do a breast self-exam: Look for Changes  1. Take off all the clothes above your waist. 2. Stand in front of a mirror in a room with good lighting. 3. Put your hands on your hips. 4. Push your hands down. 5. Look at your breasts and nipples in the mirror to see if one breast or nipple looks different than the other. Check to see if: ? The shape of one breast is different. ? The size of one breast is different. ? There are wrinkles, dips, and bumps in one breast and not the other. 6. Look at each breast for changes in your skin, such as: ? Redness. ? Scaly areas. 7. Look for changes in your nipples, such as: ? Liquid around the nipples. ? Bleeding. ? Dimpling. ? Redness. ? A change in where the nipples are. Feel for Changes 1. Lie on your back on the floor. 2. Feel each breast. To do this, follow these steps: ? Pick a breast to feel. ? Put the arm closest to that breast above your head. ? Use your other arm to feel the nipple area of your breast. Feel the area with the pads of your three middle fingers by making small circles with your fingers. For the first circle, press lightly. For the second circle, press harder. For the third circle, press even harder. ? Keep making circles with your fingers at the light, harder, and even harder pressures as you move down your breast. Stop when you feel your ribs. ? Move your fingers a little toward the  center of your body. ? Start making circles with your fingers again, this time going up until you reach your collarbone. ? Keep making up and down circles until you reach your armpit. Remember to keep using the three pressures. ? Feel the other breast in the same way. 3. Sit or stand in the shower or tub. 4. With soapy water on your skin, feel each breast the same way you did in step 2, when you were lying on the floor. Write Down What You Find After doing the self-exam, write down:  What is normal for each breast.  Any changes you find in each breast.  When you last had your period.  How often should I check my breasts? Check your breasts every month. If you are breastfeeding, the best time to check them is after you feed your baby or after you use a breast pump. If you get periods, the best time to check your breasts is 5-7 days after your period is over. When should I see my doctor? See your doctor if  you notice:  A change in shape or size of your breasts or nipples.  A change in the skin of your breast or nipples, such as red or scaly skin.  Unusual fluid coming from your nipples.  A lump or thick area that was not there before.  Pain in your breasts.  Anything that concerns you. This information is not intended to replace advice given to you by your health care provider. Make sure you discuss any questions you have with your health care provider. Document Released: 03/09/2008 Document Revised: 02/27/2016 Document Reviewed: 08/11/2015 Elsevier Interactive Patient Education  2019 [employer].

## 2019-03-20 NOTE — Progress Notes (Signed)
Pt is present today for annual exam. Pt noticed having vaginal itching that maybe a yeast infection pt stated trying OTC medication without any relief.

## 2019-03-20 NOTE — Telephone Encounter (Signed)
Pt prescreened no symptoms has face mask.   Coronavirus (COVID-19) Are you at risk?  Are you at risk for the Coronavirus (COVID-19)?  To be considered HIGH RISK for Coronavirus (COVID-19), you have to meet the following criteria:  . Traveled to [place], [place], [place], [place] or [place]; or in the [place] to [place], [place], [place], or [place]; and have fever, cough, and shortness of breath within the last 2 weeks of travel OR . Been in close contact with a person diagnosed with COVID-19 within the last 2 weeks and have fever, cough, and shortness of breath . IF YOU DO NOT MEET THESE CRITERIA, YOU ARE CONSIDERED LOW RISK FOR COVID-19.  What to do if you are HIGH RISK for COVID-19?  . If you are having a medical emergency, call 911. . Seek medical care right away. Before you go to a doctor's office, urgent care or emergency department, call ahead and tell them about your recent travel, contact with someone diagnosed with COVID-19, and your symptoms. You should receive instructions from your physician's office regarding next steps of care.  . When you arrive at healthcare provider, tell the healthcare staff immediately you have returned from visiting [place], [place], [place], [place] or [place]; or traveled in the [place] to [place], [place], [place], or [place]; in the last two weeks or you have been in close contact with a person diagnosed with COVID-19 in the last 2 weeks.   . Tell the health care staff about your symptoms: fever, cough and shortness of breath. . After you have been seen by a medical provider, you will be either: o Tested for (COVID-19) and discharged home on quarantine except to seek medical care if symptoms worsen, and asked to  - Stay home and avoid contact with others until you get your results (4-5 days)  - Avoid travel on public transportation if possible (such as bus, train, or airplane) or o Sent to the Emergency Department by EMS for  evaluation, COVID-19 testing, and possible admission depending on your condition and test results.  What to do if you are LOW RISK for COVID-19?  Reduce your risk of any infection by using the same precautions used for avoiding the common cold or flu:  . Wash your hands often with soap and warm water for at least 20 seconds.  If soap and water are not readily available, use an alcohol-based hand sanitizer with at least 60% alcohol.  . If coughing or sneezing, cover your mouth and nose by coughing or sneezing into the elbow areas of your shirt or coat, into a tissue or into your sleeve (not your hands). . Avoid shaking hands with others and consider head nods or verbal greetings only. . Avoid touching your eyes, nose, or mouth with unwashed hands.  . Avoid close contact with people who are sick. . Avoid places or events with large numbers of people in one location, like concerts or sporting events. . Carefully consider travel plans you have or are making. . If you are planning any travel outside or inside the [place], visit the CDC's Travelers' Health webpage for the latest health notices. . If you have some symptoms but not all symptoms, continue to monitor at home and seek medical attention if your symptoms worsen. . If you are having a medical emergency, call 911.   ADDITIONAL HEALTHCARE OPTIONS FOR PATIENTS  [facility] / e-Visit: [email or web address]           [facility]: [phone]  [facility]: [phone]                   [facility]: [phone]  

## 2019-03-21 ENCOUNTER — Encounter: Payer: Self-pay | Admitting: Obstetrics and Gynecology

## 2019-03-21 ENCOUNTER — Other Ambulatory Visit: Payer: Self-pay

## 2019-03-21 ENCOUNTER — Ambulatory Visit (INDEPENDENT_AMBULATORY_CARE_PROVIDER_SITE_OTHER): Payer: BC Managed Care – PPO | Admitting: Obstetrics and Gynecology

## 2019-03-21 VITALS — BP 116/87 | HR 104 | Ht 66.0 in | Wt 245.6 lb

## 2019-03-21 DIAGNOSIS — E669 Obesity, unspecified: Secondary | ICD-10-CM | POA: Diagnosis not present

## 2019-03-21 DIAGNOSIS — Z975 Presence of (intrauterine) contraceptive device: Secondary | ICD-10-CM

## 2019-03-21 DIAGNOSIS — Z01419 Encounter for gynecological examination (general) (routine) without abnormal findings: Secondary | ICD-10-CM | POA: Diagnosis not present

## 2019-03-21 NOTE — Progress Notes (Signed)
GYNECOLOGY ANNUAL PHYSICAL EXAM PROGRESS NOTE  Subjective:    Barbara [name] is a 28 y.o. G34P1011 female who presents for an annual exam.The patient is sexually active.  The patient wears seatbelts: yes. The patient participates in regular exercise: yes. Has the patient ever been transfused or tattooed?: no. The patient reports that there is not domestic violence in her life.     The patient has the following complaints/concerns today.  1. Patient notes that she just started a weight loss management program with [facility]) ~ 3 weeks ago. Has lost 5 lbs so far.  2. Patient thinks she may be getting a yeast infection. Began noting some mild external vaginal irritation last night. Notes that she used to get yeast infections fairly frequently during her pregnancy.   Gynecologic History  Menarche age: 14 Patient's last menstrual period was 02/20/2019. Contraception: Mirena IUD - inserted 09/2018. History of STI's: Denies Last Pap: 05/06/2017. Results were: normal.  Denies h/o abnormal pap smears.    OB History  Gravida Para Term Preterm AB Living  2 1 1  0 1 1  SAB TAB Ectopic Multiple Live Births  0 1 0 0 1    # Outcome Date GA Lbr Len/2nd Weight Sex Delivery Anes PTL Lv  2 Term 08/01/18 [redacted]w[redacted]d / 02:52 8 lb 6 oz (3.8 kg) F Vag-Spont EPI  LIV     Name: [name],GIRL Nahla     Apgar1: 7  Apgar5: 9  1 TAB             Past Medical History:  Diagnosis Date  . Anxiety   . Depression   . History of Papanicolaou smear of cervix 11/06/14; 02/06/16   NEG-CT POS; NEG - CT/GC/TR NEG;  . STD (sexually transmitted disease)    chlamydia    Past Surgical History:  Procedure Laterality Date  . elective abortion  11/2013   [facility]    Family History  Problem Relation Age of Onset  . Alcoholism Father   . Depression Father   . Post-traumatic stress disorder Father   . Breast cancer Maternal Grandmother 60  . Diabetes Maternal Grandfather   . Renal Disease  Maternal Grandfather   . Depression Mother     Social History   Socioeconomic History  . Marital status: Single    Spouse name: Not on file  . Number of children: Not on file  . Years of education: Not on file  . Highest education level: Not on file  Occupational History  . Not on file  Social Needs  . Financial resource strain: Not hard at all  . Food insecurity    Worry: Never true    Inability: Never true  . Transportation needs    Medical: No    Non-medical: No  Tobacco Use  . Smoking status: Former Research scientist (life sciences)  . Smokeless tobacco: Never Used  Substance and Sexual Activity  . Alcohol use: Yes    Comment: occass  . Drug use: No  . Sexual activity: Yes    Birth control/protection: I.U.D.  Lifestyle  . Physical activity    Days per week: 0 days    Minutes per session: 0 min  . Stress: Not on file  Relationships  . Social Herbalist on phone: Not on file    Gets together: Not on file    Attends religious service: Not on file    Active member of club or organization: Not on  file    Attends meetings of clubs or organizations: Not on file    Relationship status: Not on file  . Intimate partner violence    Fear of current or ex partner: Not on file    Emotionally abused: Not on file    Physically abused: Not on file    Forced sexual activity: Not on file  Other Topics Concern  . Not on file  Social History Narrative  . Not on file    Current Outpatient Medications on File Prior to Visit  Medication Sig Dispense Refill  . levonorgestrel (MIRENA) 20 MCG/24HR IUD 1 each by Intrauterine route once.    . Prenatal Vit-Fe Fumarate-FA (PRENATAL MULTIVITAMIN) TABS tablet Take 1 tablet by mouth daily at 12 noon.    . sertraline (ZOLOFT) 50 MG tablet Take 1 tablet (50 mg total) by mouth daily. 90 tablet 3   No current facility-administered medications on file prior to visit.     No Known Allergies    Review of Systems Constitutional: negative for chills,  fatigue, fevers and sweats Eyes: negative for irritation, redness and visual disturbance Ears, nose, mouth, throat, and face: negative for hearing loss, nasal congestion, snoring and tinnitus Respiratory: negative for asthma, cough, sputum Cardiovascular: negative for chest pain, dyspnea, exertional chest pressure/discomfort, irregular heart beat, palpitations and syncope Gastrointestinal: negative for abdominal pain, change in bowel habits, nausea and vomiting Genitourinary: negative for abnormal menstrual periods, genital lesions, sexual problems and vaginal discharge, dysuria and urinary incontinence. Positive for vulvar irritation x 1 day.  Integument/breast: negative for breast lump, breast tenderness and nipple discharge Hematologic/lymphatic: negative for bleeding and easy bruising Musculoskeletal:negative for back pain and muscle weakness Neurological: negative for dizziness, headaches, vertigo and weakness Endocrine: negative for diabetic symptoms including polydipsia, polyuria and skin dryness Allergic/Immunologic: negative for hay fever and urticaria        Objective:  Blood pressure 116/87, pulse (!) 104, height 5\' 6"  (1.676 m), weight 245 lb 9.6 oz (111.4 kg), last menstrual period 02/20/2019, currently breastfeeding. Body mass index is 39.64 kg/m.    General Appearance:    Alert, cooperative, no distress, appears stated age, moderate obesity  Head:    Normocephalic, without obvious abnormality, atraumatic  Eyes:    PERRL, conjunctiva/corneas clear, EOM's intact, both eyes  Ears:    Normal external ear canals, both ears  Nose:   Nares normal, septum midline, mucosa normal, no drainage or sinus tenderness  Throat:   Lips, mucosa, and tongue normal; teeth and gums normal  Neck:   Supple, symmetrical, trachea midline, no adenopathy; thyroid: no enlargement/tenderness/nodules; no carotid bruit or JVD  Back:     Symmetric, no curvature, ROM normal, no CVA tenderness  Lungs:      Clear to auscultation bilaterally, respirations unlabored  Chest Wall:    No tenderness or deformity   Heart:    Regular rate and rhythm, S1 and S2 normal, no murmur, rub or gallop  Breast Exam:    No tenderness, masses, or nipple abnormality  Abdomen:     Soft, non-tender, bowel sounds active all four quadrants, no masses, no organomegaly.    Genitalia:    Pelvic:external genitalia normal, vagina without lesions, or tenderness, rectovaginal septum  Normal. Scant thin white mucoid discharge in vaginal vault.  Cervix normal in appearance, no cervical motion tenderness.  IUD threads visualized, ~ 3 cm in length. No adnexal masses or tenderness.  Uterus normal size, shape, mobile, regular contours, nontender.  Rectal:  Normal external sphincter.  No hemorrhoids appreciated. Internal exam not done.   Extremities:   Extremities normal, atraumatic, no cyanosis or edema  Pulses:   2+ and symmetric all extremities  Skin:   Skin color, texture, turgor normal, no rashes or lesions  Lymph nodes:   Cervical, supraclavicular, and axillary nodes normal  Neurologic:   CNII-XII intact, normal strength, sensation and reflexes throughout   .  Labs:  Lab Results  Component Value Date   WBC 17.1 (H) 08/02/2018   HGB 10.8 (L) 08/02/2018   HCT 33.5 (L) 08/02/2018   MCV 91.5 08/02/2018   PLT 195 08/02/2018    Lab Results  Component Value Date   CREATININE 0.74 12/03/2013   BUN 17 12/03/2013   NA 140 12/03/2013   K 3.9 12/03/2013   CL 112 (H) 12/03/2013   CO2 20 (L) 12/03/2013    Lab Results  Component Value Date   ALT 22 12/03/2013   AST 11 (L) 12/03/2013   ALKPHOS 63 12/03/2013   BILITOT 1.1 (H) 12/03/2013    No results found for: TSH   Assessment:   Encounter for well woman exam with routine gynecological exam  Obesity (BMI 35.0-39.9 without comorbidity)  IUD (intrauterine device) in place  Lactating mother   Plan:     Blood tests: CBC with diff, Comprehensive metabolic panel,  Lipoproteins and TSH. Breast self exam technique reviewed and patient encouraged to perform self-exam monthly. Discussed healthy lifestyle modifications. Commended and encouraged patient on weight loss efforts.  Pap smear up to date. Next due in 2021.   Contraception: Mirena IUD.  Patient has been lactating for 6 months without difficulty. Commended continuing breastfeeding efforts.  F/u in 1 year for next annual exam.      Hildred Laserherry, Weronika Birch, MD [facility]

## 2019-04-14 ENCOUNTER — Telehealth: Payer: Self-pay | Admitting: Obstetrics and Gynecology

## 2019-04-14 DIAGNOSIS — R0981 Nasal congestion: Secondary | ICD-10-CM

## 2019-04-14 NOTE — Telephone Encounter (Signed)
The patient called and stated that she needs to have orders put in for Covid-19 testing. The patient is experiencing Loss of taste, smell, and congestion. The patient is requesting a call back. The patient also stated that her boyfriend is getting tested for Covid-19 because he has symptoms and may have been exposed. Please advise.

## 2019-04-14 NOTE — Telephone Encounter (Signed)
Order has been placed and notified patient. I explained to the patient that she would need to stay quarantined until results are back.

## 2019-04-20 LAB — NOVEL CORONAVIRUS, NAA: SARS-CoV-2, NAA: NOT DETECTED

## 2019-06-30 ENCOUNTER — Telehealth: Payer: Self-pay | Admitting: Obstetrics and Gynecology

## 2019-06-30 NOTE — Telephone Encounter (Signed)
Patient called back and I spoke with patient. She stated that she does not think there is any redness or heat on the breast. She also said that she was a little dizzy. She stated that symptoms just started this morning. I discussed with Dr. Amalia Hailey and he said to put warm compress and continue to pump. He said that she needed to be seen. I let the patient know what Dr. Amalia Hailey said. I suggested maybe [facility] or Urgent care. She said that she would try to hold out to be seen. I told her that [facility] walkin is open on Saturday. She said she would probably go there. She said she may wait until tomorrow to see if it gets better. I told her that if it got worse through the night to go to ED. Patient thanked me and we disconnected.

## 2019-06-30 NOTE — Telephone Encounter (Signed)
The patient is requesting a call back as soon as possible due to the patient thinking that she may have mastitis. Please advise.

## 2019-07-03 NOTE — Telephone Encounter (Signed)
Called to check on patient. She said that she is doing much better today. She went to [facility] walkin on Friday evening. She thanked me for calling to check on her. I told her that if she needed anything to let us know.

## 2019-07-03 NOTE — Telephone Encounter (Signed)
LM for patient to return call to see how she was doing from Fridays call.

## 2019-12-01 ENCOUNTER — Telehealth: Payer: Self-pay | Admitting: Obstetrics and Gynecology

## 2019-12-01 NOTE — Telephone Encounter (Signed)
Pt called in and stated that she has recently started back production milk and she said its been a month that she had stopped. The pt is requesting a call back. The milk is staining her clothes and she is considered. Please advise

## 2019-12-05 NOTE — Telephone Encounter (Signed)
Patient called to follow-up with her initial request for a prescription to be called in to stop her milk supply.

## 2019-12-07 ENCOUNTER — Ambulatory Visit: Payer: BC Managed Care – PPO

## 2019-12-07 NOTE — Telephone Encounter (Signed)
Pt is calling in due to not having been called and she stated it has been over a week. The pt is requesting a call from the nurse. Please advise

## 2019-12-07 NOTE — Telephone Encounter (Signed)
Spoke with pt concerning ways to spot the breast milk. Pt stated it has been over a 4 months since she last seen the breast milk. Pt stated that she was informed by someone that there were medication she can take to stop the breast milk and just wanted the medication. Pt was informed that [facility] suggested that she try cabbage leaf, benadryl, bandage wraps. Pt stated that she don't understand why she has to do all of the if her milk stopped on it own last time. Pt was encouraged to try during that but she became upset and stating that she don't understand why she is having to do all of that and did not have to do it the last time. Pt was scheduled an appointment to see [facility].

## 2019-12-15 ENCOUNTER — Ambulatory Visit: Payer: BC Managed Care – PPO | Admitting: Obstetrics and Gynecology

## 2019-12-15 ENCOUNTER — Other Ambulatory Visit: Payer: Self-pay

## 2019-12-15 ENCOUNTER — Encounter: Payer: Self-pay | Admitting: Obstetrics and Gynecology

## 2019-12-15 VITALS — BP 96/60 | HR 77 | Ht 66.0 in | Wt 228.5 lb

## 2019-12-15 DIAGNOSIS — N643 Galactorrhea not associated with childbirth: Secondary | ICD-10-CM

## 2019-12-15 DIAGNOSIS — F419 Anxiety disorder, unspecified: Secondary | ICD-10-CM | POA: Diagnosis not present

## 2019-12-15 MED ORDER — BUPROPION HCL ER (XL) 150 MG PO TB24: 150.0000 mg | ORAL_TABLET | Freq: Every day | ORAL | 3 refills | Status: DC

## 2019-12-15 MED ORDER — LORAZEPAM 0.5 MG PO TABS: 0.5000 mg | ORAL_TABLET | Freq: Three times a day (TID) | ORAL | 1 refills | Status: DC

## 2019-12-15 NOTE — Progress Notes (Signed)
Pt present for noticing her breast leaking breast milk after no milk for 4-5 months. Pt stated that the liquid coming from the breast are staining her clothes and do not look like milk. Pt denies any pain in her breast.

## 2019-12-15 NOTE — Progress Notes (Signed)
    GYNECOLOGY PROGRESS NOTE  Subjective:    Patient ID: Barbara [name], female    DOB: 18-Sep-1991, 29 y.o.   MRN: [id number]  HPI  Patient is a 29 y.o. G65P1011 female who presents for complaints of leaking from her left breast for the past 1-2 months. The discharge is clear, but sometimes milky.  Notes that she sees it most often when getting out of the shower, or if she attempts very hard to express. It causes staining in her clothes. She reports that she stopped breastfeeding in October.   Additionally patient desires to talk about her anxiety.  She is currently on Zoloft, has been on the medication for management of postpartum depression and anxiety, however notes that she does not feel like it is working for her any more. Also notes that she doe not like the side effects (doesn't feel like her self, decreased sex drive, increasing appetite, although she doesn't know if this is due to the medication itself or her increasing anxiety). She reports that her panic attacks have resumed over the past few months and can vary from 2-4 episodes in a month up to 1-2 episodes per week. She has a history of Ativan use in the past for her anxiety but has not been on the medication since her pregnancy. She does note some increased stress at work.    The following portions of the patient's history were reviewed and updated as appropriate: allergies, current medications, past family history, past medical history, past social history, past surgical history and problem list.  Review of Systems Pertinent items noted in HPI and remainder of comprehensive ROS otherwise negative.   Objective:   Blood pressure 96/60, pulse 77, height 5\' 6"  (1.676 m), weight 228 lb 8 oz (103.6 kg), currently breastfeeding. General appearance: alert and no distress Breast: Breasts: breasts appear normal, no suspicious masses, no skin or nipple changes or axillary nodes.  Unable to express any discharge from the breast bilaterally on  today's exam. Psych: tearful during exam, normal affect, normal speech, mood slightly depressed.. Does not appear anxious currently.    Assessment:   1. Galactorrhea in female   2. Anxiety    Plan:   1. Galactorrhea - discussed possible causes of galactorrhea.  Patient not on any medications that could induce galactorrhea. Will check TSH and prolactin levels.  2. Anxiety - patient with history of depression and anxiety. Anxiety has worsened with resumption of panic attacks. Discussed other alternative anti-anxiolytics and antidepressants as patient not happy with current medication.  Based on symptoms, offered Wellbutrin as treatment option due to decreased side effects, and can aid with overeating. Will also resume patient's Ativan for now due to increased number of panic attacks until more stable with Wellbutrin. To f/u in 4 weeks to reassess symptoms.    A total of 15 minutes were spent face-to-face with the patient during this encounter and over half of that time dealt with counseling and coordination of care.   , MD [facility]

## 2019-12-15 NOTE — Patient Instructions (Signed)
Bupropion tablets (Depression/Mood Disorders) What is this medicine? BUPROPION (byoo PROE pee on) is used to treat depression. This medicine may be used for other purposes; ask your health care provider or pharmacist if you have questions. COMMON BRAND NAME(S): Wellbutrin What should I tell my health care provider before I take this medicine? They need to know if you have any of these conditions:  an eating disorder, such as anorexia or bulimia  bipolar disorder or psychosis  diabetes or high blood sugar, treated with medication  glaucoma  heart disease, previous heart attack, or irregular heart beat  head injury or brain tumor  high blood pressure  kidney or liver disease  seizures  suicidal thoughts or a previous suicide attempt  Tourette's syndrome  weight loss  an unusual or allergic reaction to bupropion, other medicines, foods, dyes, or preservatives  breast-feeding  pregnant or trying to become pregnant How should I use this medicine? Take this medicine by mouth with a glass of water. Follow the directions on the prescription label. You can take it with or without food. If it upsets your stomach, take it with food. Take your medicine at regular intervals. Do not take your medicine more often than directed. Do not stop taking this medicine suddenly except upon the advice of your doctor. Stopping this medicine too quickly may cause serious side effects or your condition may worsen. A special MedGuide will be given to you by the pharmacist with each prescription and refill. Be sure to read this information carefully each time. Talk to your pediatrician regarding the use of this medicine in children. Special care may be needed. Overdosage: If you think you have taken too much of this medicine contact a poison control center or emergency room at once. NOTE: This medicine is only for you. Do not share this medicine with others. What if I miss a dose? If you miss a dose,  take it as soon as you can. If it is less than four hours to your next dose, take only that dose and skip the missed dose. Do not take double or extra doses. What may interact with this medicine? Do not take this medicine with any of the following medications:  linezolid  MAOIs like Azilect, Carbex, Eldepryl, Marplan, Nardil, and Parnate  methylene blue (injected into a vein)  other medicines that contain bupropion like Zyban This medicine may also interact with the following medications:  alcohol  certain medicines for anxiety or sleep  certain medicines for blood pressure like metoprolol, propranolol  certain medicines for depression or psychotic disturbances  certain medicines for HIV or AIDS like efavirenz, lopinavir, nelfinavir, ritonavir  certain medicines for irregular heart beat like propafenone, flecainide  certain medicines for Parkinson's disease like amantadine, levodopa  certain medicines for seizures like carbamazepine, phenytoin, phenobarbital  cimetidine  clopidogrel  cyclophosphamide  digoxin  furazolidone  isoniazid  nicotine  orphenadrine  procarbazine  steroid medicines like prednisone or cortisone  stimulant medicines for attention disorders, weight loss, or to stay awake  tamoxifen  theophylline  thiotepa  ticlopidine  tramadol  warfarin This list may not describe all possible interactions. Give your health care provider a list of all the medicines, herbs, non-prescription drugs, or dietary supplements you use. Also tell them if you smoke, drink alcohol, or use illegal drugs. Some items may interact with your medicine. What should I watch for while using this medicine? Tell your doctor if your symptoms do not get better or if they get worse.   Visit your doctor or healthcare provider for regular checks on your progress. Because it may take several weeks to see the full effects of this medicine, it is important to continue your  treatment as prescribed by your doctor. This medicine may cause serious skin reactions. They can happen weeks to months after starting the medicine. Contact your healthcare provider right away if you notice fevers or flu-like symptoms with a rash. The rash may be red or purple and then turn into blisters or peeling of the skin. Or, you might notice a red rash with swelling of the face, lips or lymph nodes in your neck or under your arms. Patients and their families should watch out for new or worsening thoughts of suicide or depression. Also watch out for sudden changes in feelings such as feeling anxious, agitated, panicky, irritable, hostile, aggressive, impulsive, severely restless, overly excited and hyperactive, or not being able to sleep. If this happens, especially at the beginning of treatment or after a change in dose, call your healthcare provider. Avoid alcoholic drinks while taking this medicine. Drinking excessive alcoholic beverages, using sleeping or anxiety medicines, or quickly stopping the use of these agents while taking this medicine may increase your risk for a seizure. Do not drive or use heavy machinery until you know how this medicine affects you. This medicine can impair your ability to perform these tasks. Do not take this medicine close to bedtime. It may prevent you from sleeping. Your mouth may get dry. Chewing sugarless gum or sucking hard candy, and drinking plenty of water may help. Contact your doctor if the problem does not go away or is severe. What side effects may I notice from receiving this medicine? Side effects that you should report to your doctor or health care professional as soon as possible:  allergic reactions like skin rash, itching or hives, swelling of the face, lips, or tongue  breathing problems  changes in vision  confusion  elevated mood, decreased need for sleep, racing thoughts, impulsive behavior  fast or irregular  heartbeat  hallucinations, loss of contact with reality  increased blood pressure  rash, fever, and swollen lymph nodes  redness, blistering, peeling, or loosening of the skin, including inside the mouth  seizures  suicidal thoughts or other mood changes  unusually weak or tired  vomiting Side effects that usually do not require medical attention (report to your doctor or health care professional if they continue or are bothersome):  constipation  headache  loss of appetite  nausea  tremors  weight loss This list may not describe all possible side effects. Call your doctor for medical advice about side effects. You may report side effects to FDA at 1-800-FDA-1088. Where should I keep my medicine? Keep out of the reach of children. Store at room temperature between 20 and 25 degrees C (68 and 77 degrees F), away from direct sunlight and moisture. Keep tightly closed. Throw away any unused medicine after the expiration date. NOTE: This sheet is a summary. It may not cover all possible information. If you have questions about this medicine, talk to your doctor, pharmacist, or health care provider.  2020 Elsevier/Gold Standard (2018-12-15 14:02:47)  

## 2019-12-17 ENCOUNTER — Encounter: Payer: Self-pay | Admitting: Obstetrics and Gynecology

## 2019-12-19 ENCOUNTER — Other Ambulatory Visit: Payer: BC Managed Care – PPO

## 2019-12-20 ENCOUNTER — Other Ambulatory Visit: Payer: Self-pay

## 2019-12-20 ENCOUNTER — Other Ambulatory Visit: Payer: BC Managed Care – PPO

## 2019-12-21 LAB — PROLACTIN: Prolactin: 9.1 ng/mL (ref 4.8–23.3)

## 2019-12-21 LAB — TSH: TSH: 0.909 u[IU]/mL (ref 0.450–4.500)

## 2020-01-16 ENCOUNTER — Other Ambulatory Visit: Payer: Self-pay

## 2020-01-16 ENCOUNTER — Encounter: Payer: Self-pay | Admitting: Obstetrics and Gynecology

## 2020-01-16 ENCOUNTER — Ambulatory Visit (INDEPENDENT_AMBULATORY_CARE_PROVIDER_SITE_OTHER): Payer: BC Managed Care – PPO | Admitting: Obstetrics and Gynecology

## 2020-01-16 VITALS — Ht 66.0 in | Wt 217.8 lb

## 2020-01-16 DIAGNOSIS — R632 Polyphagia: Secondary | ICD-10-CM | POA: Diagnosis not present

## 2020-01-16 DIAGNOSIS — N643 Galactorrhea not associated with childbirth: Secondary | ICD-10-CM | POA: Diagnosis not present

## 2020-01-16 DIAGNOSIS — F419 Anxiety disorder, unspecified: Secondary | ICD-10-CM | POA: Diagnosis not present

## 2020-01-16 DIAGNOSIS — F329 Major depressive disorder, single episode, unspecified: Secondary | ICD-10-CM

## 2020-01-16 DIAGNOSIS — E669 Obesity, unspecified: Secondary | ICD-10-CM

## 2020-01-16 NOTE — Progress Notes (Signed)
Gynecology Virtual Visit via Telephone Note  I connected with Barbara [name] on 01/16/20 at  1:30 PM EDT by telephone and verified that I am speaking with the correct person using two identifiers.   I discussed the limitations, risks, security and privacy concerns of performing an evaluation and management service by telephone and the availability of in person appointments. I also discussed with the patient that there may be a patient responsible charge related to this service. The patient expressed understanding and agreed to proceed.   History of Present Illness: Barbara [name] is a 29 y.o. G43P1011 female who presents for follow-up of several issues:   1. Discontinued Zoloft last visit and was initiated on Wellbutrin for her depression and anxiety symptoms with panic attacks, along with resuming her Ativan. She reports that she is doing very well on medication, and is not having any side effects.  Current dose is very effective for her.  Has had some use of the Ativan but has not required it much..  2. Overeating - patient was unsure if her weight issues were due to her depression, stress and anxiety, or if the medication (Zoloft) was causing her weight issues. Notes since starting Wellbutrin last month she has lost ~ 10 lbs.  She is exercising some (although not consistently). And also being more mindful of her diet.  3. Galactorrhea - unknown cause, had stopped breastfeeding last October. Recent labs were normal. Still notes it is occurring intermittently. Has not yet tried anything such as antihistamines or cabbage leaves.    The following portions of the patient's history were reviewed and updated as appropriate: allergies, current medications, past family history, past medical history, past social history, past surgical history and problem list.    Review of Systems Pertinent items noted in HPI and remainder of comprehensive ROS otherwise  negative.   Observations/Objective:  Height 5\' 6"  (1.676 m), weight 217 lb 12.8 oz (98.8 kg), last menstrual period 01/10/2020, currently breastfeeding.   Vitals are self-reported.   Physical Exam:  General appearance: overall sounds pleasant, no distress.  Psych: normal mood and affect. Normal speech. See PHQ-9  Remainder of exam deferred as this was a a televisit.     Depression screen [facility] 2/9 01/16/2020 02/17/2018  Decreased Interest 1 2  Down, Depressed, Hopeless 0 2  PHQ - 2 Score 1 4  Altered sleeping 1 3  Tired, decreased energy 1 3  Change in appetite 0 2  Feeling bad or failure about yourself  1 -  Trouble concentrating 0 1  Moving slowly or fidgety/restless 0 1  Suicidal thoughts 0 0  PHQ-9 Score 4 14  Difficult doing work/chores Somewhat difficult -     Assessment and Plan: 1. Depression and anxiety - continue Wellbutrin as she is doing well, and Ativan as needed. PHQ-9 score has improved. GAD 7 not initially performed, but sent to patient to complete after her visit. Will f/u with results.  2. Overeating - doing well on Wellbutrin, has noted good weight loss. Continue to encourage lifestyle modifications in addition to use of medication.  3. Galactorrhea - patient has not tried any other interventions to date but notes she plans on trying Benadryl and cabbage leaves soon, since her labs are normal.   Follow Up Instructions:  I discussed the assessment and treatment plan with the patient. The patient was provided an opportunity to ask questions and all were answered. The patient agreed with the plan and demonstrated an  understanding of the instructions.   The patient was advised to call back or seek an in-person evaluation if the symptoms worsen or if the condition fails to improve as anticipated.  I provided  11 minutes of non-face-to-face time during this encounter, including performing screening assessment.   Rubie Maid, MD [facility]

## 2020-01-16 NOTE — Progress Notes (Signed)
Televisit=pt has televisit for a follow up for depression. All information update. Pt stated that she has noticed some improvement in her mood since starting the medication. PHQ-9=4.

## 2020-02-21 ENCOUNTER — Other Ambulatory Visit: Payer: Self-pay

## 2020-02-21 ENCOUNTER — Telehealth: Payer: Self-pay | Admitting: Obstetrics and Gynecology

## 2020-02-21 NOTE — Telephone Encounter (Signed)
Patient called in saying that she wanted to make an appointment for a bug bite, informed patient that we are an OB-GYN and that she would need to see her PCP provider. Also informed patient that the nurse advised she go to urgent care however the patient doesn't want to pay the urgent care prices. Patient asked that we refer her to a primary care facility. Patient also made commentary about how she gets her antidepressants here as well as she had her child with [place], but we cant treat things outside of our scope. Informed patient that I understood her frustrations and that I would send a message for patient and the nurse will get back with her in 24-48 hours. Patient verbalized understanding.

## 2020-02-23 ENCOUNTER — Other Ambulatory Visit: Payer: Self-pay

## 2020-02-23 DIAGNOSIS — Z7689 Persons encountering health services in other specified circumstances: Secondary | ICD-10-CM

## 2020-02-28 ENCOUNTER — Telehealth: Payer: Self-pay | Admitting: Obstetrics and Gynecology

## 2020-02-28 NOTE — Telephone Encounter (Signed)
Pt called in and stated that she was bite by chigger's. The pt is transferring her care to [facility]. The pt is gave a verbal release for her records.

## 2020-02-28 NOTE — Telephone Encounter (Signed)
Patient called in saying that she needed a referral from [place] for her to go to a PCP because she was bit my chiggers and wants to be treated for it. Informed patient that I would send a message to nurse and see what we could do. Patient started crying on the phone because she needed [place] to do that today and I explained to her I would send a telephone message to the nurse and she hung up the phone. Could you please advise?

## 2020-02-29 NOTE — Telephone Encounter (Signed)
Please see another phone encounter.  

## 2020-02-29 NOTE — Telephone Encounter (Signed)
Please see mychart message encounter. Called pt today to check on her status of the rash and bites she had on her entire body. Pt stated that she went to urgent care and was treated pt stated that they did not know what the bites and rash was. Pt was advised that [facility] had a few openings next week. Pt stated that she would wait until she finish her medication for 10 days and see if the rash and bites clear up. Pt was advised that if she needed to come into the office to be seen by [facility] to please call and make an appt. Pt voiced that she think she will be okay until her scheduled appt 03/22/2020. Pt was advised that she could also see a Dermatologist as well. Pt stated that she would look into that.

## 2020-03-07 ENCOUNTER — Encounter: Payer: BC Managed Care – PPO | Admitting: Obstetrics and Gynecology

## 2020-03-21 ENCOUNTER — Encounter: Payer: BC Managed Care – PPO | Admitting: Obstetrics and Gynecology

## 2020-03-22 ENCOUNTER — Encounter: Payer: BC Managed Care – PPO | Admitting: Obstetrics and Gynecology

## 2020-03-22 NOTE — Progress Notes (Deleted)
Pt present for annual exam.  

## 2020-06-19 ENCOUNTER — Other Ambulatory Visit: Payer: Self-pay

## 2020-06-19 ENCOUNTER — Other Ambulatory Visit (HOSPITAL_COMMUNITY)
Admission: RE | Admit: 2020-06-19 | Discharge: 2020-06-19 | Disposition: A | Discharge status: Home / Self Care | Payer: BC Managed Care – PPO | Source: Ambulatory Visit | Attending: Obstetrics and Gynecology | Admitting: Obstetrics and Gynecology

## 2020-06-19 ENCOUNTER — Encounter: Payer: Self-pay | Admitting: Obstetrics and Gynecology

## 2020-06-19 ENCOUNTER — Ambulatory Visit (INDEPENDENT_AMBULATORY_CARE_PROVIDER_SITE_OTHER): Payer: BC Managed Care – PPO | Admitting: Obstetrics and Gynecology

## 2020-06-19 VITALS — BP 124/87 | HR 72 | Ht 66.0 in | Wt 207.8 lb

## 2020-06-19 DIAGNOSIS — Z975 Presence of (intrauterine) contraceptive device: Secondary | ICD-10-CM

## 2020-06-19 DIAGNOSIS — Z23 Encounter for immunization: Secondary | ICD-10-CM | POA: Diagnosis not present

## 2020-06-19 DIAGNOSIS — Z124 Encounter for screening for malignant neoplasm of cervix: Secondary | ICD-10-CM

## 2020-06-19 DIAGNOSIS — F329 Major depressive disorder, single episode, unspecified: Secondary | ICD-10-CM

## 2020-06-19 DIAGNOSIS — Z8049 Family history of malignant neoplasm of other genital organs: Secondary | ICD-10-CM

## 2020-06-19 DIAGNOSIS — N643 Galactorrhea not associated with childbirth: Secondary | ICD-10-CM

## 2020-06-19 DIAGNOSIS — Z803 Family history of malignant neoplasm of breast: Secondary | ICD-10-CM

## 2020-06-19 DIAGNOSIS — N6323 Unspecified lump in the left breast, lower outer quadrant: Secondary | ICD-10-CM | POA: Diagnosis not present

## 2020-06-19 DIAGNOSIS — F419 Anxiety disorder, unspecified: Secondary | ICD-10-CM

## 2020-06-19 DIAGNOSIS — Z01419 Encounter for gynecological examination (general) (routine) without abnormal findings: Secondary | ICD-10-CM

## 2020-06-19 NOTE — Patient Instructions (Signed)
Preventive Care 20-28 Years Old, Female Preventive care refers to visits with your health care provider and lifestyle choices that can promote health and wellness. This includes:  A yearly physical exam. This may also be called an annual well check.  Regular dental visits and eye exams.  Immunizations.  Screening for certain conditions.  Healthy lifestyle choices, such as eating a healthy diet, getting regular exercise, not using drugs or products that contain nicotine and tobacco, and limiting alcohol use. What can I expect for my preventive care visit? Physical exam Your health care provider will check your:  Height and weight. This may be used to calculate body mass index (BMI), which tells if you are at a healthy weight.  Heart rate and blood pressure.  Skin for abnormal spots. Counseling Your health care provider may ask you questions about your:  Alcohol, tobacco, and drug use.  Emotional well-being.  Home and relationship well-being.  Sexual activity.  Eating habits.  Work and work Statistician.  Method of birth control.  Menstrual cycle.  Pregnancy history. What immunizations do I need?  Influenza (flu) vaccine  This is recommended every year. Tetanus, diphtheria, and pertussis (Tdap) vaccine  You may need a Td booster every 10 years. Varicella (chickenpox) vaccine  You may need this if you have not been vaccinated. Human papillomavirus (HPV) vaccine  If recommended by your health care provider, you may need three doses over 6 months. Measles, mumps, and rubella (MMR) vaccine  You may need at least one dose of MMR. You may also need a second dose. Meningococcal conjugate (MenACWY) vaccine  One dose is recommended if you are age 75-21 years and a first-year college student living in a residence hall, or if you have one of several medical conditions. You may also need additional booster doses. Pneumococcal conjugate (PCV13) vaccine  You may need  this if you have certain conditions and were not previously vaccinated. Pneumococcal polysaccharide (PPSV23) vaccine  You may need one or two doses if you smoke cigarettes or if you have certain conditions. Hepatitis A vaccine  You may need this if you have certain conditions or if you travel or work in places where you may be exposed to hepatitis A. Hepatitis B vaccine  You may need this if you have certain conditions or if you travel or work in places where you may be exposed to hepatitis B. Haemophilus influenzae type b (Hib) vaccine  You may need this if you have certain conditions. You may receive vaccines as individual doses or as more than one vaccine together in one shot (combination vaccines). Talk with your health care provider about the risks and benefits of combination vaccines. What tests do I need?  Blood tests  Lipid and cholesterol levels. These may be checked every 5 years starting at age 33.  Hepatitis C test.  Hepatitis B test. Screening  Diabetes screening. This is done by checking your blood sugar (glucose) after you have not eaten for a while (fasting).  Sexually transmitted disease (STD) testing.  BRCA-related cancer screening. This may be done if you have a family history of breast, ovarian, tubal, or peritoneal cancers.  Pelvic exam and Pap test. This may be done every 3 years starting at age 76. Starting at age 102, this may be done every 5 years if you have a Pap test in combination with an HPV test. Talk with your health care provider about your test results, treatment options, and if necessary, the need for more tests.  Follow these instructions at home: Eating and drinking   Eat a diet that includes fresh fruits and vegetables, whole grains, lean protein, and low-fat dairy.  Take vitamin and mineral supplements as recommended by your health care provider.  Do not drink alcohol if: ? Your health care provider tells you not to drink. ? You are  pregnant, may be pregnant, or are planning to become pregnant.  If you drink alcohol: ? Limit how much you have to 0-1 drink a day. ? Be aware of how much alcohol is in your drink. In the [place] one drink equals one 12 oz bottle of beer (355 mL), one 5 oz glass of wine (148 mL), or one 1 oz glass of hard liquor (44 mL). Lifestyle  Take daily care of your teeth and gums.  Stay active. Exercise for at least 30 minutes on 5 or more days each week.  Do not use any products that contain nicotine or tobacco, such as cigarettes, e-cigarettes, and chewing tobacco. If you need help quitting, ask your health care provider.  If you are sexually active, practice safe sex. Use a condom or other form of birth control (contraception) in order to prevent pregnancy and STIs (sexually transmitted infections). If you plan to become pregnant, see your health care provider for a preconception visit. What's next?  Visit your health care provider once a year for a well check visit.  Ask your health care provider how often you should have your eyes and teeth checked.  Stay up to date on all vaccines. This information is not intended to replace advice given to you by your health care provider. Make sure you discuss any questions you have with your health care provider. Document Revised: 06/02/2018 Document Reviewed: 06/02/2018 Elsevier Patient Education  2020 Elsevier Inc. Breast Self-Awareness Breast self-awareness is knowing how your breasts look and feel. Doing breast self-awareness is important. It allows you to catch a breast problem early while it is still small and can be treated. All women should do breast self-awareness, including women who have had breast implants. Tell your doctor if you notice a change in your breasts. What you need:  A mirror.  A well-lit room. How to do a breast self-exam A breast self-exam is one way to learn what is normal for your breasts and to check for changes. To do a  breast self-exam: Look for changes  1. Take off all the clothes above your waist. 2. Stand in front of a mirror in a room with good lighting. 3. Put your hands on your hips. 4. Push your hands down. 5. Look at your breasts and nipples in the mirror to see if one breast or nipple looks different from the other. Check to see if: ? The shape of one breast is different. ? The size of one breast is different. ? There are wrinkles, dips, and bumps in one breast and not the other. 6. Look at each breast for changes in the skin, such as: ? Redness. ? Scaly areas. 7. Look for changes in your nipples, such as: ? Liquid around the nipples. ? Bleeding. ? Dimpling. ? Redness. ? A change in where the nipples are. Feel for changes  1. Lie on your back on the floor. 2. Feel each breast. To do this, follow these steps: ? Pick a breast to feel. ? Put the arm closest to that breast above your head. ? Use your other arm to feel the nipple area of your breast. Feel   the area with the pads of your three middle fingers by making small circles with your fingers. For the first circle, press lightly. For the second circle, press harder. For the third circle, press even harder. ? Keep making circles with your fingers at the different pressures as you move down your breast. Stop when you feel your ribs. ? Move your fingers a little toward the center of your body. ? Start making circles with your fingers again, this time going up until you reach your collarbone. ? Keep making up-and-down circles until you reach your armpit. Remember to keep using the three pressures. ? Feel the other breast in the same way. 3. Sit or stand in the tub or shower. 4. With soapy water on your skin, feel each breast the same way you did in step 2 when you were lying on the floor. Write down what you find Writing down what you find can help you remember what to tell your doctor. Write down:  What is normal for each breast.  Any  changes you find in each breast, including: ? The kind of changes you find. ? Whether you have pain. ? Size and location of any lumps.  When you last had your menstrual period. General tips  Check your breasts every month.  If you are breastfeeding, the best time to check your breasts is after you feed your baby or after you use a breast pump.  If you get menstrual periods, the best time to check your breasts is 5-7 days after your menstrual period is over.  With time, you will become comfortable with the self-exam, and you will begin to know if there are changes in your breasts. Contact a doctor if you:  See a change in the shape or size of your breasts or nipples.  See a change in the skin of your breast or nipples, such as red or scaly skin.  Have fluid coming from your nipples that is not normal.  Find a lump or thick area that was not there before.  Have pain in your breasts.  Have any concerns about your breast health. Summary  Breast self-awareness includes looking for changes in your breasts, as well as feeling for changes within your breasts.  Breast self-awareness should be done in front of a mirror in a well-lit room.  You should check your breasts every month. If you get menstrual periods, the best time to check your breasts is 5-7 days after your menstrual period is over.  Let your doctor know of any changes you see in your breasts, including changes in size, changes on the skin, pain or tenderness, or fluid from your nipples that is not normal. This information is not intended to replace advice given to you by your health care provider. Make sure you discuss any questions you have with your health care provider. Document Revised: 05/10/2018 Document Reviewed: 05/10/2018 Elsevier Patient Education  2020 Elsevier Inc.  

## 2020-06-19 NOTE — Progress Notes (Signed)
GYNECOLOGY ANNUAL PHYSICAL EXAM PROGRESS NOTE  Subjective:    Barbara [name] is a 29 y.o. G32P1011 female who presents for an annual exam.The patient is sexually active.  The patient wears seatbelts: yes. The patient participates in regular exercise: yes. Has the patient ever been transfused or tattooed?: no. The patient reports that there is not domestic violence in her life.     The patient has the following complaints/concerns today.  1. Patient notes mother was just diagnosed with endometrial cancer, just had surgery 4 weeks ago, doing well.   2. Notes that her anxiety and depression have been doing better. PCP started her on Celexa for better control of her  anger outbursts. Doing much better.  Still likes being on her Wellbutrin. Notes panic attacks are much less. Also doing some meditation.  Overall feeling the best she has felt in some time.   3. Has been doing well with weight loss.  Currently on Weight Watchers and along with the Wellbutrin, has lost almost 60 lbs in 6 months.  4. Still reporting nipple discharge from her right breast, intermittent, mostly milky.  Stopped breastfeeding almost 1 year ago.  Has tried antihistamines, cabbage leaves without success.    Gynecologic History  Menarche age: 38 No LMP recorded. (Menstrual status: IUD). Notes rare menstrual cycle, lasts 2 days. Last one was last month.  Contraception: Mirena IUD - inserted 09/2018. History of STI's: Denies Last Pap: 05/06/2017. Results were: normal.  Denies h/o abnormal pap smears.   OB History  Gravida Para Term Preterm AB Living  2 1 1  0 1 1  SAB TAB Ectopic Multiple Live Births  0 1 0 0 1    # Outcome Date GA Lbr Len/2nd Weight Sex Delivery Anes PTL Lv  2 Term 08/01/18 103w5d / 02:52 8 lb 6 oz (3.8 kg) F Vag-Spont EPI  LIV     Name: [name],Barbara Anshika     Apgar1: 7  Apgar5: 9  1 TAB             Past Medical History:  Diagnosis Date  . Anxiety   . Depression   . History of Papanicolaou smear  of cervix 11/06/14; 02/06/16   NEG-CT POS; NEG - CT/GC/TR NEG;  . STD (sexually transmitted disease)    chlamydia    Past Surgical History:  Procedure Laterality Date  . elective abortion  11/2013   [facility]    Family History  Problem Relation Age of Onset  . Alcoholism Father   . Depression Father   . Post-traumatic stress disorder Father   . Breast cancer Maternal Grandmother 60  . Diabetes Maternal Grandfather   . Renal Disease Maternal Grandfather   . Depression Mother   . Endometrial cancer Mother     Social History   Socioeconomic History  . Marital status: Single    Spouse name: Not on file  . Number of children: Not on file  . Years of education: Not on file  . Highest education level: Not on file  Occupational History  . Not on file  Tobacco Use  . Smoking status: Former 12/2013  . Smokeless tobacco: Never Used  Vaping Use  . Vaping Use: Never used  Substance and Sexual Activity  . Alcohol use: Yes    Comment: occass  . Drug use: No  . Sexual activity: Yes    Birth control/protection: I.U.D.  Other Topics Concern  . Not on file  Social History Narrative  .  Not on file   Social Determinants of Health   Financial Resource Strain:   . Difficulty of Paying Living Expenses: Not on file  Food Insecurity:   . Worried About Programme researcher, broadcasting/film/video in the Last Year: Not on file  . Ran Out of Food in the Last Year: Not on file  Transportation Needs:   . Lack of Transportation (Medical): Not on file  . Lack of Transportation (Non-Medical): Not on file  Physical Activity:   . Days of Exercise per Week: Not on file  . Minutes of Exercise per Session: Not on file  Stress:   . Feeling of Stress : Not on file  Social Connections:   . Frequency of Communication with Friends and Family: Not on file  . Frequency of Social Gatherings with Friends and Family: Not on file  . Attends Religious Services: Not on file  . Active Member of Clubs or  Organizations: Not on file  . Attends Banker Meetings: Not on file  . Marital Status: Not on file  Intimate Partner Violence:   . Fear of Current or Ex-Partner: Not on file  . Emotionally Abused: Not on file  . Physically Abused: Not on file  . Sexually Abused: Not on file    Current Outpatient Medications on File Prior to Visit  Medication Sig Dispense Refill  . buPROPion (WELLBUTRIN XL) 150 MG 24 hr tablet Take 1 tablet (150 mg total) by mouth daily. 90 tablet 3  . citalopram (CELEXA) 20 MG tablet Take 20 mg by mouth daily.    Marland Kitchen levonorgestrel (MIRENA) 20 MCG/24HR IUD 1 each by Intrauterine route once.    Marland Kitchen LORazepam (ATIVAN) 0.5 MG tablet Take 1 tablet (0.5 mg total) by mouth every 8 (eight) hours. 30 tablet 1   No current facility-administered medications on file prior to visit.    Allergies  Allergen Reactions  . Pineapple Itching    Throat itching   . Doxycycline Itching      Review of Systems Constitutional: negative for chills, fatigue, fevers and sweats Eyes: negative for irritation, redness and visual disturbance Ears, nose, mouth, throat, and face: negative for hearing loss, nasal congestion, snoring and tinnitus Respiratory: negative for asthma, cough, sputum Cardiovascular: negative for chest pain, dyspnea, exertional chest pressure/discomfort, irregular heart beat, palpitations and syncope Gastrointestinal: negative for abdominal pain, change in bowel habits, nausea and vomiting Genitourinary: negative for abnormal menstrual periods, genital lesions, sexual problems and vaginal discharge, dysuria and urinary incontinence.  Integument/breast: negative for breast lump, breast tenderness and nipple discharge Hematologic/lymphatic: negative for bleeding and easy bruising Musculoskeletal:negative for back pain and muscle weakness Neurological: negative for dizziness, headaches, vertigo and weakness Endocrine: negative for diabetic symptoms including  polydipsia, polyuria and skin dryness Allergic/Immunologic: negative for hay fever and urticaria     Objective:  Blood pressure 124/87, pulse 72, height 5\' 6"  (1.676 m), weight 207 lb 12.8 oz (94.3 kg), currently breastfeeding. Body mass index is 33.54 kg/m.    General Appearance:    Alert, cooperative, no distress, appears stated age, mild obesity  Head:    Normocephalic, without obvious abnormality, atraumatic  Eyes:    PERRL, conjunctiva/corneas clear, EOM's intact, both eyes  Ears:    Normal external ear canals, both ears  Nose:   Nares normal, septum midline, mucosa normal, no drainage or sinus tenderness  Throat:   Lips, mucosa, and tongue normal; teeth and gums normal  Neck:   Supple, symmetrical, trachea midline, no adenopathy; thyroid:  no enlargement/tenderness/nodules; no carotid bruit or JVD  Back:     Symmetric, no curvature, ROM normal, no CVA tenderness  Lungs:     Clear to auscultation bilaterally, respirations unlabored  Chest Wall:    No tenderness or deformity   Heart:    Regular rate and rhythm, S1 and S2 normal, no murmur, rub or gallop  Breast Exam:    No tenderness, or nipple abnormality.   Abdomen:     Soft, non-tender, bowel sounds active all four quadrants, no masses, no organomegaly.    Genitalia:    Pelvic:external genitalia normal, vagina without lesions, or tenderness, rectovaginal septum  Normal. Scant thin white mucoid discharge in vaginal vault.  Cervix normal in appearance, no cervical motion tenderness.  IUD threads visualized, ~ 0.5 cm in length. No adnexal masses or tenderness.  Uterus normal size, shape, mobile, regular contours, nontender.  Rectal:    Normal external sphincter.  No hemorrhoids appreciated. Internal exam not done.   Extremities:   Extremities normal, atraumatic, no cyanosis or edema  Pulses:   2+ and symmetric all extremities  Skin:   Skin color, texture, turgor normal, no rashes or lesions  Lymph nodes:   Cervical, supraclavicular, and  axillary nodes normal  Neurologic:   CNII-XII intact, normal strength, sensation and reflexes throughout   .  Labs:  Labs reviewed in Care Everywhere  Assessment:   1. Encounter for well woman exam with routine gynecological exam   2. Pap smear for cervical cancer screening   3. Flu vaccine need   4. Mass of lower outer quadrant of left breast   5. Galactorrhea on right side   6. Family history of breast cancer in female   74. Anxiety and depression   8. IUD (intrauterine device) in place   9. Family history of malignant neoplasm of endometrium    Plan:    Blood tests: Had labs done by PCP in June. Breast self exam technique reviewed and patient encouraged to perform self-exam monthly. Discussed healthy lifestyle modifications. Congratulated patient on current weight loss efforts.  Pap smear performed. Continue routine screening.   Contraception: Mirena IUD. Due for removal in 2024.  Still noting occasional breast leaking on right side x 1 year. Also mass noted on left breast at lower outer quadrant, possible fibroma. Will get bilateral breast ultrasound.  Family h/o endometrial cancer in mother, patient notes her mother recently had hereditary cancer screening but has not gotten results yet.  Patient also with family history of breast cancer. Based on results, can stratify patient's personal risk. Flu vaccine given today.  Anxiety and depression doing well on current meds.  F/u in 1 year for next annual exam.      Hildred Laser, MD [facility]

## 2020-06-19 NOTE — Progress Notes (Signed)
Pt present for annual exam. Pt stated that she is doing well no yeast infection since her last visit and having breast milk leaking from right breast. Flu vaccine administered.

## 2020-06-21 LAB — CYTOLOGY - PAP: Diagnosis: NEGATIVE

## 2020-12-19 NOTE — Progress Notes (Signed)
    Routine Prenatal Care Visit  Delayed chart closure.  Subjective  Barbara [name] is a 30 y.o. G2P1011 at [redacted]w[redacted]d being seen today for ongoing prenatal care.  She is currently monitored for the following issues for this low-risk pregnancy and has Anxiety and depression and Increased BMI on their problem list.  ----------------------------------------------------------------------------------- Patient reports no complaints.   Reports normal fetal movement. Denies contractions. Denies vaginal bleeding.  Denies leaking of fluid.  ----------------------------------------------------------------------------------- The following portions of the patient's history were reviewed and updated as appropriate: allergies, current medications, past family history, past medical history, past social history, past surgical history and problem list. Problem list updated.   Objective  Blood pressure 120/70, weight 193 lb (87.5 kg), last menstrual period 11/01/2017, currently breastfeeding. Pregravid weight 192 lb (87.1 kg) Total Weight Gain 1 lb (0.454 kg) Urinalysis: Urine Protein (RETIRED): Negative Urine Glucose (RETIRED): Negative  Fetal Status: heart tones present  General:  Alert, oriented and cooperative. Patient is in no acute distress.  Skin: Skin is warm and dry. No rash noted.   Cardiovascular: Normal heart rate noted  Respiratory: Normal respiratory effort, no problems with respiration noted  Abdomen: Soft, gravid, appropriate for gestational age. Pain/Pressure: Present     Pelvic:  Cervical exam deferred        Extremities: Normal range of motion.     Mental Status: Normal mood and affect. Normal behavior. Normal judgment and thought content.     Assessment   30 y.o. G2P1011 at [redacted]w[redacted]d by  08/03/2018, by Ultrasound presenting for routine prenatal visit  Plan   pregnancy 2 Problems (from 10/29/17 to 08/03/18)    Problem Noted Resolved   Supervision of normal pregnancy 12/21/2017 by  Natale Milch, MD 08/17/2018 by Hildred Laser, MD   Overview Addendum 12/21/2017 12:11 PM by Natale Milch, MD      [facility]inic [facility] Prenatal Labs  Dating  7wk [place] Blood type: O/Positive/-- (03/14 1547)   Genetic Screen 1 Screen:     AFP:      Quad:      NIPS:    Antibody:Negative (03/14 1547)  Anatomic [place]  Rubella: 2.62 (03/14 1547) Varicella: Immune  GTT Early:        28 wk:      RPR: Non Reactive (03/14 1547)   Rhogam  not applicable HBsAg: Negative (03/14 1547)   TDaP vaccine                       HIV: Non Reactive (03/14 1547)   Flu Shot   12/16/17                             GBS:   Contraception  IUD Pap: NIL 05/2017  CBB     CS/VBAC    Baby Food    Support Person             Previous Version       Gestational age appropriate obstetric precautions including but not limited to vaginal bleeding, contractions, leaking of fluid and fetal movement were reviewed in detail with the patient.    Return in about 2 weeks (around 01/19/2018) for 1GTT and ROB.  Natale Milch MD [facility], [facility] 12/19/2020, 9:14 AM

## 2021-01-27 ENCOUNTER — Telehealth: Payer: Self-pay | Admitting: Obstetrics and Gynecology

## 2021-01-27 MED ORDER — BUPROPION HCL ER (XL) 150 MG PO TB24: 150.0000 mg | ORAL_TABLET | Freq: Every day | ORAL | 0 refills | Status: DC

## 2021-01-27 NOTE — Telephone Encounter (Signed)
Barbara [name] called in and states she would like to  Go ahead and have the ultrasound performed on her breast.  She states Dr. Valentino Saxon wanted it to be done at her last physical on 06/2020.  She called [facility] and tried to schedule it and they told her a new order had to be placed.  Please advise.

## 2021-01-27 NOTE — Telephone Encounter (Signed)
  1. Which medications need to be refilled? (please list name of each medication and dose if known)    Wellbutrin XL 150mg    2. Which pharmacy/location (including street and city if local pharmacy) is medication to be sent to?   [facility] in [place], [place]    3. Do they need a 30 day or 90 day supply?   90 day supply

## 2021-01-27 NOTE — Telephone Encounter (Signed)
Refill sent and LM for patient that it had been sent to pharmacy.

## 2021-01-27 NOTE — Telephone Encounter (Signed)
Please advise 

## 2021-01-28 ENCOUNTER — Other Ambulatory Visit: Payer: Self-pay | Admitting: Obstetrics and Gynecology

## 2021-01-28 DIAGNOSIS — N643 Galactorrhea not associated with childbirth: Secondary | ICD-10-CM

## 2021-01-28 NOTE — Telephone Encounter (Signed)
I have placed a new order as the other order expired.

## 2021-01-28 NOTE — Telephone Encounter (Signed)
Notified patient and gave her the number to schedule.

## 2021-02-03 ENCOUNTER — Telehealth: Payer: Self-pay | Admitting: Obstetrics and Gynecology

## 2021-02-03 NOTE — Telephone Encounter (Signed)
NEW MESSAGE:   PT STATES THAT SHE HAS A MAMMOGRAM SCHEDULED FOR HER RIGHT BREAST SHE IS REQUESTING THAT WE ADD THE LEFT. SHE STATES THAT THERE IS A WEIRD TINGLING SENSATION ON THAT SIDE AS WELL

## 2021-02-03 NOTE — Telephone Encounter (Signed)
Did you need to see the pt or do you want me to add a left mammogram order. Please advise. Thanks [employer]

## 2021-02-04 NOTE — Telephone Encounter (Signed)
You can cancel the right breast and change it to bilateral screening mammo.

## 2021-02-05 ENCOUNTER — Other Ambulatory Visit: Payer: Self-pay

## 2021-02-05 DIAGNOSIS — N6323 Unspecified lump in the left breast, lower outer quadrant: Secondary | ICD-10-CM

## 2021-02-05 DIAGNOSIS — N644 Mastodynia: Secondary | ICD-10-CM

## 2021-02-05 DIAGNOSIS — Z803 Family history of malignant neoplasm of breast: Secondary | ICD-10-CM

## 2021-02-05 DIAGNOSIS — N643 Galactorrhea not associated with childbirth: Secondary | ICD-10-CM

## 2021-02-05 NOTE — Telephone Encounter (Signed)
Completed.

## 2021-02-06 ENCOUNTER — Ambulatory Visit
Admission: RE | Admit: 2021-02-06 | Discharge: 2021-02-06 | Disposition: A | Discharge status: Home / Self Care | Payer: BC Managed Care – PPO | Source: Ambulatory Visit | Attending: Obstetrics and Gynecology | Admitting: Obstetrics and Gynecology

## 2021-02-06 ENCOUNTER — Other Ambulatory Visit: Payer: Self-pay

## 2021-02-06 DIAGNOSIS — Z803 Family history of malignant neoplasm of breast: Secondary | ICD-10-CM | POA: Insufficient documentation

## 2021-02-06 DIAGNOSIS — N644 Mastodynia: Secondary | ICD-10-CM | POA: Diagnosis present

## 2021-02-06 DIAGNOSIS — N643 Galactorrhea not associated with childbirth: Secondary | ICD-10-CM

## 2021-02-06 DIAGNOSIS — N6323 Unspecified lump in the left breast, lower outer quadrant: Secondary | ICD-10-CM | POA: Insufficient documentation

## 2021-05-05 ENCOUNTER — Other Ambulatory Visit: Payer: Self-pay | Admitting: Obstetrics and Gynecology

## 2021-05-05 NOTE — Telephone Encounter (Signed)
Needs to be scheduled for an annual exam

## 2021-05-13 NOTE — Telephone Encounter (Signed)
Patient called. Was informed by the person that answered the phone that the patient was not available to talk. LM with the person for pt to contact the office.

## 2021-05-28 ENCOUNTER — Other Ambulatory Visit: Payer: Self-pay

## 2021-05-28 NOTE — Telephone Encounter (Signed)
Please advise. Contacted pt to call the office to schedule an annual appointment. Barbara [name]

## 2021-05-29 MED ORDER — LORAZEPAM 0.5 MG PO TABS: 0.5000 mg | ORAL_TABLET | Freq: Three times a day (TID) | ORAL | 1 refills | Status: DC | PRN

## 2021-05-29 MED ORDER — BUPROPION HCL ER (XL) 150 MG PO TB24: 150.0000 mg | ORAL_TABLET | Freq: Every day | ORAL | 0 refills | Status: DC

## 2021-05-29 NOTE — Telephone Encounter (Signed)
Patient needs appointment prior to next refill.  

## 2021-05-30 NOTE — Telephone Encounter (Signed)
Patient has an annual exam on 07/08/2021 at 3pm.

## 2021-07-07 NOTE — Progress Notes (Deleted)
GYNECOLOGY ANNUAL PHYSICAL EXAM PROGRESS NOTE  Subjective:    Barbara [name] is a 30 y.o. G2P1011 female who presents for an annual exam. The patient is sexually active. The patient participates in regular exercise: yes. Has the patient ever been transfused or tattooed?: no. The patient reports that there is not domestic violence in her life.   The patient has the following complaints today.  1.   Gynecologic History:  Menarche age: 13 No LMP recorded. (Menstrual status: IUD).  Contraception: IUD (Mirena 09/2018) History of STI's: Denies Last Pap: 05/06/2017. Results were: normal.  Denies h/o abnormal pap smears. Last mammogram: Not applicable due to age.  OB History  Gravida Para Term Preterm AB Living  2 1 1  0 1 1  SAB IAB Ectopic Multiple Live Births  0 1 0 0 1    # Outcome Date GA Lbr Len/2nd Weight Sex Delivery Anes PTL Lv  2 Term 08/01/18 [redacted]w[redacted]d / 02:52 8 lb 6 oz (3.8 kg) F Vag-Spont EPI  LIV     Name: Barbara [name]     Apgar1: 7  Apgar5: 9  1 IAB             Past Medical History:  Diagnosis Date   Anxiety    Depression    History of Papanicolaou smear of cervix 11/06/14; 02/06/16   NEG-CT POS; NEG - CT/GC/TR NEG;   STD (sexually transmitted disease)    chlamydia    Past Surgical History:  Procedure Laterality Date   elective abortion  11/2013   [facility] [place] [facility]    Family History  Problem Relation Age of Onset   Alcoholism Father    Depression Father    Post-traumatic stress disorder Father    Breast cancer Maternal Grandmother 3   Diabetes Maternal Grandfather    Renal Disease Maternal Grandfather    Depression Mother    Endometrial cancer Mother     Social History   Socioeconomic History   Marital status: Married    Spouse name: Not on file   Number of children: Not on file   Years of education: Not on file   Highest education level: Not on file  Occupational History   Not on file  Tobacco Use   Smoking status: Former    Smokeless tobacco: Never  Vaping Use   Vaping Use: Never used  Substance and Sexual Activity   Alcohol use: Yes    Comment: occass   Drug use: No   Sexual activity: Yes    Birth control/protection: I.U.D.  Other Topics Concern   Not on file  Social History Narrative   Not on file   Social Determinants of Health   Financial Resource Strain: Not on file  Food Insecurity: Not on file  Transportation Needs: Not on file  Physical Activity: Not on file  Stress: Not on file  Social Connections: Not on file  Intimate Partner Violence: Not on file    Current Outpatient Medications on File Prior to Visit  Medication Sig Dispense Refill   buPROPion (WELLBUTRIN XL) 150 MG 24 hr tablet Take 1 tablet (150 mg total) by mouth daily. 90 tablet 0   citalopram (CELEXA) 20 MG tablet Take 20 mg by mouth daily.     levonorgestrel (MIRENA) 20 MCG/24HR IUD 1 each by Intrauterine route once.     LORazepam (ATIVAN) 0.5 MG tablet Take 1 tablet (0.5 mg total) by mouth every 8 (eight) hours as needed for anxiety. 30 tablet  1   No current facility-administered medications on file prior to visit.    Allergies  Allergen Reactions   Pineapple Itching    Throat itching    Doxycycline Itching    All over itching, no hives     Review of Systems Constitutional: negative for chills, fatigue, fevers and sweats Eyes: negative for irritation, redness and visual disturbance Ears, nose, mouth, throat, and face: negative for hearing loss, nasal congestion, snoring and tinnitus Respiratory: negative for asthma, cough, sputum Cardiovascular: negative for chest pain, dyspnea, exertional chest pressure/discomfort, irregular heart beat, palpitations and syncope Gastrointestinal: negative for abdominal pain, change in bowel habits, nausea and vomiting Genitourinary: negative for abnormal menstrual periods, genital lesions, sexual problems and vaginal discharge, dysuria and urinary incontinence Integument/breast:  negative for breast lump, breast tenderness and nipple discharge Hematologic/lymphatic: negative for bleeding and easy bruising Musculoskeletal:negative for back pain and muscle weakness Neurological: negative for dizziness, headaches, vertigo and weakness Endocrine: negative for diabetic symptoms including polydipsia, polyuria and skin dryness Allergic/Immunologic: negative for hay fever and urticaria      Objective:  currently breastfeeding. There is no height or weight on file to calculate BMI.    General Appearance:    Alert, cooperative, no distress, appears stated age  Head:    Normocephalic, without obvious abnormality, atraumatic  Eyes:    PERRL, conjunctiva/corneas clear, EOM's intact, both eyes  Ears:    Normal external ear canals, both ears  Nose:   Nares normal, septum midline, mucosa normal, no drainage or sinus tenderness  Throat:   Lips, mucosa, and tongue normal; teeth and gums normal  Neck:   Supple, symmetrical, trachea midline, no adenopathy; thyroid: no enlargement/tenderness/nodules; no carotid bruit or JVD  Back:     Symmetric, no curvature, ROM normal, no CVA tenderness  Lungs:     Clear to auscultation bilaterally, respirations unlabored  Chest Wall:    No tenderness or deformity   Heart:    Regular rate and rhythm, S1 and S2 normal, no murmur, rub or gallop  Breast Exam:    No tenderness, masses, or nipple abnormality  Abdomen:     Soft, non-tender, bowel sounds active all four quadrants, no masses, no organomegaly.    Genitalia:    Pelvic:external genitalia normal, vagina without lesions, discharge, or tenderness, rectovaginal septum  normal. Cervix normal in appearance, no cervical motion tenderness, no adnexal masses or tenderness.  Uterus normal size, shape, mobile, regular contours, nontender.  Rectal:    Normal external sphincter.  No hemorrhoids appreciated. Internal exam not done.   Extremities:   Extremities normal, atraumatic, no cyanosis or edema   Pulses:   2+ and symmetric all extremities  Skin:   Skin color, texture, turgor normal, no rashes or lesions  Lymph nodes:   Cervical, supraclavicular, and axillary nodes normal  Neurologic:   CNII-XII intact, normal strength, sensation and reflexes throughout   .  Labs:  Lab Results  Component Value Date   WBC 17.1 (H) 08/02/2018   HGB 10.8 (L) 08/02/2018   HCT 33.5 (L) 08/02/2018   MCV 91.5 08/02/2018   PLT 195 08/02/2018    Lab Results  Component Value Date   CREATININE 0.74 12/03/2013   BUN 17 12/03/2013   NA 140 12/03/2013   K 3.9 12/03/2013   CL 112 (H) 12/03/2013   CO2 20 (L) 12/03/2013    Lab Results  Component Value Date   ALT 22 12/03/2013   AST 11 (L) 12/03/2013   ALKPHOS 63  12/03/2013   BILITOT 1.1 (H) 12/03/2013    Lab Results  Component Value Date   TSH 0.909 12/20/2019     Assessment:   No diagnosis found.   Plan:  Blood tests: {blood tests:13147}. Breast self exam technique reviewed and patient encouraged to perform self-exam monthly. Contraception: IUD. Discussed healthy lifestyle modifications. Mammogram  Not applicable due to age Pap smear  UTD . COVID vaccination status: Follow up in 1 year for annual exam  Hildred Laser, MD [facility]

## 2021-07-08 ENCOUNTER — Encounter: Payer: Self-pay | Admitting: Obstetrics and Gynecology

## 2021-07-24 NOTE — Telephone Encounter (Signed)
Patient has annual appt 10/30/2021.
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
## 2021-09-08 ENCOUNTER — Ambulatory Visit (INDEPENDENT_AMBULATORY_CARE_PROVIDER_SITE_OTHER): Payer: BC Managed Care – PPO | Admitting: Psychiatry

## 2021-09-08 ENCOUNTER — Other Ambulatory Visit: Payer: Self-pay

## 2021-09-08 ENCOUNTER — Encounter: Payer: Self-pay | Admitting: Psychiatry

## 2021-09-08 VITALS — BP 133/87 | HR 83 | Wt 213.4 lb

## 2021-09-08 DIAGNOSIS — F331 Major depressive disorder, recurrent, moderate: Secondary | ICD-10-CM

## 2021-09-08 DIAGNOSIS — F4310 Post-traumatic stress disorder, unspecified: Secondary | ICD-10-CM

## 2021-09-08 MED ORDER — ARIPIPRAZOLE 2 MG PO TABS: 2.0000 mg | ORAL_TABLET | Freq: Every day | ORAL | 1 refills | Status: DC

## 2021-09-08 MED ORDER — CLONAZEPAM 0.5 MG PO TABS: 0.5000 mg | ORAL_TABLET | Freq: Every day | ORAL | 0 refills | Status: DC | PRN

## 2021-09-08 MED ORDER — CITALOPRAM HYDROBROMIDE 40 MG PO TABS: 40.0000 mg | ORAL_TABLET | Freq: Every day | ORAL | 1 refills | Status: DC

## 2021-09-08 NOTE — Patient Instructions (Signed)
Increase citalopram 40 mg daily Discontinue Trintellix Continue Abilify 2 mg daily Next appointment: 1/10 at 8:30, video Referral for therapy Referral for IOP

## 2021-09-09 ENCOUNTER — Telehealth (HOSPITAL_COMMUNITY): Payer: Self-pay | Admitting: Psychiatry

## 2021-09-10 ENCOUNTER — Telehealth (HOSPITAL_COMMUNITY): Payer: Self-pay | Admitting: Psychiatry

## 2021-09-10 NOTE — Telephone Encounter (Signed)
D:  Dr. Vanetta Shawl referred pt to [facility].  A:  Placed call to orient pt.  Pt states she was referred to [facility] for individual therapy and prefers to do that first.  Informed Dr. Vanetta Shawl.  Encouraged pt to call case mgr in the future if she changes her mind.  R:  Pt receptive.

## 2021-09-18 ENCOUNTER — Ambulatory Visit (INDEPENDENT_AMBULATORY_CARE_PROVIDER_SITE_OTHER): Payer: BC Managed Care – PPO | Admitting: Psychologist

## 2021-09-18 DIAGNOSIS — F411 Generalized anxiety disorder: Secondary | ICD-10-CM | POA: Diagnosis not present

## 2021-09-18 DIAGNOSIS — F331 Major depressive disorder, recurrent, moderate: Secondary | ICD-10-CM | POA: Diagnosis not present

## 2021-09-18 NOTE — Plan of Care (Signed)
Goals Alleviate depressive symptoms Recognize, accept, and cope with depressive feelings Develop healthy thinking patterns Develop healthy interpersonal relationships  Objectives Cooperate with a medication evaluation by a physician Verbalize an accurate understanding of depression Verbalize an understanding of the treatment Identify and replace thoughts that support depression Learn and implement behavioral strategies Verbalize an understanding and resolution of current interpersonal problems Learn and implement problem solving and decision making skills Learn and implement conflict resolution skills to resolve interpersonal problems Verbalize an understanding of healthy and unhealthy emotions verbalize insight into how past relationships may be influence current experiences with depression Use mindfulness and acceptance strategies and increase value based behavior  Increase hopeful statements about the future.  Interventions Consistent with treatment model, discuss how change in cognitive, behavioral, and interpersonal can help client alleviate depression CBT Behavioral activation help the client explore the relationship, nature of the dispute,  Help the client develop new interpersonal skills and relationships Conduct Problem so living therapy Teach conflict resolution skills Use a process-experiential approach Conduct TLDP Conduct ACT Evaluate need for psychotropic medication Monitor adherence to medication  Goals Reduce overall frequency, intensity, and duration of anxiety Stabilize anxiety level wile increasing ability to function Enhance ability to effectively cope with full variety of stressors Learn and implement coping skills that result in a reduction of anxiety   Objectives Verbalize an understanding of the cognitive, physiological, and behavioral components of anxiety Learning and implement calming skills to reduce overall anxiety Verbalize an understanding of the  role that cognitive biases play in excessive irrational worry and persistent anxiety symptoms Identify, challenge, and replace based fearful talk Learn and implement problem solving strategies Identify and engage in pleasant activities Learning and implement personal and interpersonal skills to reduce anxiety and improve interpersonal relationships Learn to accept limitations in life and commit to tolerating, rather than avoiding, unpleasant emotions while accomplishing meaningful goals Identify major life conflicts from the past and present that form the basis for present anxiety Maintain involvement in work, family, and social activities Reestablish a consistent sleep-wake cycle Cooperate with a medical evaluation  Interventions Engage the patient in behavioral activation Use instruction, modeling, and role-playing to build the client's general social, communication, and/or conflict resolution skills Use Acceptance and Commitment Therap7y to help client accept uncomfortable realities in order to accomplish value-consistent goals Reinforce the client's insight into the role of his/her past emotional pain and present anxiety  Support the client in following through with work, family, and social activities Teach and implement sleep hygiene practices  Refer the patient to a physician for a psychotropic medication consultation Monito the clint's psychotropic medication compliance Discuss how anxiety typically involves excessive worry, various bodily expressions of tension, and avoidance of what is threatening that interact to maintain the problem  Teach the patient relaxation skills Assign the patient homework Discuss examples demonstrating that unrealistic worry overestimates the probability of threats and und3restmiates patient's ability  Assist the patient in analyzing his or her worries Help patient understand that avoidance is reinforcing   Problem: Depression CCP Problem  1 Alleviate  depressive symptoms Goal: STG: Milta WILL PARTICIPATE IN AT LEAST 80% OF SCHEDULED INDIVIDUAL PSYCHOTHERAPY SESSIONS Outcome: Not Progressing Goal: STG: Marie WILL COMPLETE AT LEAST 80% OF ASSIGNED HOMEWORK Outcome: Not Progressing   Problem: Anxiety Disorder CCP Problem  1 Alleviate anxiety symptoms.  Goal: STG: Patient will participate in at least 80% of scheduled individual psychotherapy sessions Outcome: Not Progressing Goal: STG: Patient will attend couples/family counseling sessions with partner/family member 1x  per week for the next 4 weeks Outcome: Not Progressing
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
## 2021-09-23 ENCOUNTER — Ambulatory Visit (INDEPENDENT_AMBULATORY_CARE_PROVIDER_SITE_OTHER): Payer: BC Managed Care – PPO | Admitting: Psychologist

## 2021-09-23 DIAGNOSIS — F411 Generalized anxiety disorder: Secondary | ICD-10-CM

## 2021-09-23 DIAGNOSIS — F331 Major depressive disorder, recurrent, moderate: Secondary | ICD-10-CM | POA: Diagnosis not present

## 2021-09-23 NOTE — Progress Notes (Signed)
[facility] Counselor/Therapist Progress Note  Patient ID: Barbara [name], MRN: [id number],    Date: 09/23/2021  Time Spent: 2:30 pm to 2:50 pm; total time: 20 minutes   This session was held via video webex teletherapy due to the coronavirus risk at this time. The patient consented to video teletherapy and was located at her home during this session. She is aware it is the responsibility of the patient to secure confidentiality on her end of the session. The provider was in a private home office for the duration of this session. Limits of confidentiality were discussed with the patient.   Treatment Type: Individual Therapy  Reported Symptoms: Depressive symptoms.   Mental Status Exam: Appearance:  Neat     Behavior: Appropriate  Motor: NA  Speech/Language:  Normal Rate  Affect: Appropriate  Mood: normal  Thought process: normal  Thought content:   WNL  Sensory/Perceptual disturbances:   WNL  Orientation: oriented to person  Attention: Good  Concentration: Good  Memory: WNL  Fund of knowledge:  Good  Insight:   Fair  Judgment:  Poor  Impulse Control: Good   Risk Assessment: Danger to Self:  No Self-injurious Behavior: No Danger to Others: No Duty to Warn:no Physical Aggression / Violence:No  Access to Firearms a concern: No  Gang Involvement:No   Subjective: Patient began the session processing frustrations over her family dynamics related to the holidays. She processed thoughts and emotions related to challenges she experiences with her family. She asked to follow up. She denied suicidal and homicidal ideation.    Interventions:  Worked on developing a therapeutic relationship with the patient using active listening and reflective statements. Provided emotional support using empathy and validation. Reviewed the treatment plan. Reviewed events since the intake. Normalized and validated expressed emotions. Explored family dynamics. Attempted to assist with  problem solving. Pointed out discrepancies. Attempted to assist with writing a letter. Provided empathic statements. Assessed for suicidal and homicidal ideation.   Homework: NA  Next Session: Emotional support and coping strategies  Diagnosis: F33.1 major depressive affective disorder, recurrent, moderate and F41.1 generalized anxiety disorder.   Plan:  Client Abilities: Friendly and easy to develop rapport  Client Preferences: ACT and CBT  Client statement of Needs: Coping skills and process emotions  Treatment Level:Outpatient    Goals Alleviate depressive symptoms Recognize, accept, and cope with depressive feelings Develop healthy thinking patterns Develop healthy interpersonal relationships Reduce overall frequency, intensity, and duration of anxiety Stabilize anxiety level wile increasing ability to function Enhance ability to effectively cope with full variety of stressors Learn and implement coping skills that result in a reduction of anxiety   Objectives: Target Date for all Objectives is 09/18/2022 Verbalize an understanding of the cognitive, physiological, and behavioral components of anxiety Learning and implement calming skills to reduce overall anxiety Verbalize an understanding of the role that cognitive biases play in excessive irrational worry and persistent anxiety symptoms Identify, challenge, and replace based fearful talk Learn and implement problem solving strategies Identify and engage in pleasant activities Learning and implement personal and interpersonal skills to reduce anxiety and improve interpersonal relationships Learn to accept limitations in life and commit to tolerating, rather than avoiding, unpleasant emotions while accomplishing meaningful goals Identify major life conflicts from the past and present that form the basis for present anxiety Maintain involvement in work, family, and social activities Reestablish a consistent sleep-wake  cycle Cooperate with a medical evaluation  Cooperate with a medication evaluation by a physician Verbalize  an accurate understanding of depression Verbalize an understanding of the treatment Identify and replace thoughts that support depression Learn and implement behavioral strategies Verbalize an understanding and resolution of current interpersonal problems Learn and implement problem solving and decision making skills Learn and implement conflict resolution skills to resolve interpersonal problems Verbalize an understanding of healthy and unhealthy emotions verbalize insight into how past relationships may be influence current experiences with depression Use mindfulness and acceptance strategies and increase value based behavior  Increase hopeful statements about the future.  Interventions Engage the patient in behavioral activation Use instruction, modeling, and role-playing to build the client's general social, communication, and/or conflict resolution skills Use Acceptance and Commitment Therapy to help client accept uncomfortable realities in order to accomplish value-consistent goals Reinforce the client's insight into the role of his/her past emotional pain and present anxiety  Support the client in following through with work, family, and social activities Teach and implement sleep hygiene practices  Refer the patient to a physician for a psychotropic medication consultation Monito the clint's psychotropic medication compliance Discuss how anxiety typically involves excessive worry, various bodily expressions of tension, and avoidance of what is threatening that interact to maintain the problem  Teach the patient relaxation skills Assign the patient homework Discuss examples demonstrating that unrealistic worry overestimates the probability of threats and underestimates patient's ability  Assist the patient in analyzing his or her worries Help patient understand that avoidance  is reinforcing  Consistent with treatment model, discuss how change in cognitive, behavioral, and interpersonal can help client alleviate depression CBT Behavioral activation help the client explore the relationship, nature of the dispute,  Help the client develop new interpersonal skills and relationships Conduct Problem so living therapy Teach conflict resolution skills Use a process-experiential approach Conduct TLDP Conduct ACT Evaluate need for psychotropic medication Monitor adherence to medication     Hilbert Corrigan, PsyD

## 2021-10-07 ENCOUNTER — Telehealth: Payer: Self-pay | Admitting: Psychiatry

## 2021-10-07 NOTE — Telephone Encounter (Signed)
Contacted the patient due to her request. 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 She verbalized understanding that the letter will be written after the next visit if that is appropriate.

## 2021-10-15 ENCOUNTER — Telehealth: Payer: Self-pay

## 2021-10-15 ENCOUNTER — Other Ambulatory Visit: Payer: Self-pay | Admitting: Psychiatry

## 2021-10-15 MED ORDER — CITALOPRAM HYDROBROMIDE 40 MG PO TABS: 40.0000 mg | ORAL_TABLET | Freq: Every day | ORAL | 0 refills | Status: DC

## 2021-10-15 NOTE — Telephone Encounter (Signed)
received fax requesting a 90 day supply of the citalopram 40mg 

## 2021-10-15 NOTE — Telephone Encounter (Signed)
Ordered
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
## 2021-10-17 ENCOUNTER — Telehealth: Payer: BC Managed Care – PPO | Admitting: Psychiatry

## 2021-10-20 ENCOUNTER — Telehealth (INDEPENDENT_AMBULATORY_CARE_PROVIDER_SITE_OTHER): Payer: BC Managed Care – PPO | Admitting: Psychiatry

## 2021-10-20 ENCOUNTER — Other Ambulatory Visit: Payer: Self-pay

## 2021-10-20 ENCOUNTER — Ambulatory Visit: Payer: BC Managed Care – PPO | Admitting: Psychologist

## 2021-10-20 ENCOUNTER — Encounter: Payer: Self-pay | Admitting: Psychiatry

## 2021-10-20 ENCOUNTER — Telehealth: Payer: Self-pay | Admitting: *Deleted

## 2021-10-20 DIAGNOSIS — F331 Major depressive disorder, recurrent, moderate: Secondary | ICD-10-CM

## 2021-10-20 DIAGNOSIS — F4310 Post-traumatic stress disorder, unspecified: Secondary | ICD-10-CM

## 2021-10-20 MED ORDER — ARIPIPRAZOLE 2 MG PO TABS: 2.0000 mg | ORAL_TABLET | Freq: Every day | ORAL | 1 refills | Status: DC

## 2021-10-20 NOTE — Patient Instructions (Signed)
Continue citalopram 40 mg daily Continue Abilify 2 mg daily Next appointment: 2/23 at 8:30, video

## 2021-10-20 NOTE — Telephone Encounter (Signed)
Provider gived staff a letter that was requested by patient. Per provider to have patient to sign a release of information form giving consent before letter can be released.   Staff called patient and was not able to reach patient. LMOM and office number was provided on voicemail.

## 2021-10-23 ENCOUNTER — Ambulatory Visit: Payer: BC Managed Care – PPO | Admitting: Psychologist

## 2021-10-30 ENCOUNTER — Ambulatory Visit: Payer: Self-pay | Admitting: Psychologist

## 2021-10-30 ENCOUNTER — Encounter: Payer: Self-pay | Admitting: Obstetrics and Gynecology
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
## 2021-11-27 ENCOUNTER — Other Ambulatory Visit: Payer: Self-pay

## 2021-11-27 ENCOUNTER — Encounter: Payer: Self-pay | Admitting: Psychiatry

## 2021-11-27 ENCOUNTER — Ambulatory Visit: Payer: Self-pay | Admitting: Psychologist

## 2021-11-27 ENCOUNTER — Telehealth (INDEPENDENT_AMBULATORY_CARE_PROVIDER_SITE_OTHER): Payer: Self-pay | Admitting: Psychiatry

## 2021-11-27 ENCOUNTER — Telehealth: Payer: Self-pay | Admitting: Psychiatry

## 2021-11-27 DIAGNOSIS — F4310 Post-traumatic stress disorder, unspecified: Secondary | ICD-10-CM

## 2021-11-27 DIAGNOSIS — F330 Major depressive disorder, recurrent, mild: Secondary | ICD-10-CM

## 2021-11-27 MED ORDER — ARIPIPRAZOLE 5 MG PO TABS: 5.0000 mg | ORAL_TABLET | Freq: Every day | ORAL | 1 refills | Status: DC

## 2021-11-27 NOTE — Telephone Encounter (Signed)
Could you contact her -she asks the letter to be submitted to her work.  It is ready since last month.  Please obtain consent form for this letter to be given.

## 2021-11-27 NOTE — Patient Instructions (Signed)
Continue citalopram 40 mg daily Continue Abilify 2 mg daily Next appointment: 4/6 at 10:30

## 2021-11-27 NOTE — Telephone Encounter (Signed)
Called 11-27-21 @ 1:47 left message to call office back

## 2021-11-28 ENCOUNTER — Encounter: Payer: Self-pay | Admitting: Obstetrics and Gynecology

## 2021-12-31 DIAGNOSIS — S52122A Displaced fracture of head of left radius, initial encounter for closed fracture: Secondary | ICD-10-CM | POA: Diagnosis not present

## 2022-01-07 NOTE — Progress Notes (Signed)
Virtual Visit via Video Note ? ?I connected with Barbara [name] on 01/08/22 at 10:30 AM EDT by a video enabled telemedicine application and verified that I am speaking with the correct person using two identifiers. ? ?Location: ?Patient: store ?Provider: office ?Persons participated in the visit- patient, provider  ?  ?I discussed the limitations of evaluation and management by telemedicine and the availability of in person appointments. The patient expressed understanding and agreed to proceed. ? ? ?  ?I discussed the assessment and treatment plan with the patient. The patient was provided an opportunity to ask questions and all were answered. The patient agreed with the plan and demonstrated an understanding of the instructions. ?  ?The patient was advised to call back or seek an in-person evaluation if the symptoms worsen or if the condition fails to improve as anticipated. ? ?I provided 10 minutes of non-face-to-face time during this encounter. ? ? ?Barbara Hotter, MD ? ? ? ?BH MD/PA/NP OP Progress Note ? ?01/08/2022 11:06 AM ?Barbara [name]  ?MRN:  [id number] ? ?Chief Complaint:  ?Chief Complaint  ?Patient presents with  ? Follow-up  ? Depression  ? ?HPI:  ?This is a follow-up appointment for depression and PTSD.  She is in the middle of shopping in a store.  She agrees to proceed with interview.  ?She states that she has been doing good.  She had a fracture in her after she slipped.  Although she was feeling down, her mood has been okay lately.  She is doing well at work.  Although it can be overwhelming at times, she reports good teamwork, and she appreciates this.  She thinks higher dose of Abilify has been definitely helpful for her mood.  She has insomnia, she attributes to fracture of her arm.  She has noticed weight gain.  However, she feels comfortable to stay on the medication and she wants to work on the diet first.  She denies SI.  She denies nightmares.  Although she tends to think about the past, it  has been more manageable.  She has hypervigilance.  She denies alcohol use or drug use.  She feels comfortable to stay on the medication as it is.  ? ? ?230 lbs ?Wt Readings from Last 3 Encounters:  ?09/08/21 213 lb 6.4 oz (96.8 kg)  ?06/19/20 207 lb 12.8 oz (94.3 kg)  ?01/16/20 217 lb 12.8 oz (98.8 kg)  ? ? ? ?Daily routine: ?Exercise: ?Employment: Runner, broadcasting/film/video for five years ?Support: husband, grandmother ?Household: husband, 74 year old girl ?Marital status: married ?Number of children: 1 ?Education: BS ? ? ? ?Visit Diagnosis:  ?  ICD-10-CM   ?1. Mild episode of recurrent major depressive disorder (HCC)  F33.0 TSH  ?  ?2. PTSD (post-traumatic stress disorder)  F43.10   ?  ? ? ?Past Psychiatric History: Please see initial evaluation for full details. I have reviewed the history. No updates at this time.  ?  ? ?Past Medical History:  ?Past Medical History:  ?Diagnosis Date  ? Anxiety   ? Depression   ? History of Papanicolaou smear of cervix 11/06/14; 02/06/16  ? NEG-CT POS; NEG - CT/GC/TR NEG;  ? STD (sexually transmitted disease)   ? chlamydia  ?  ?Past Surgical History:  ?Procedure Laterality Date  ? elective abortion  11/2013  ? [facility]  ? ? ?Family Psychiatric History: Please see initial evaluation for full details. I have reviewed the history. No updates at this time.  ? ? ?  Family History:  ?Family History  ?Problem Relation Age of Onset  ? Depression Mother   ? Endometrial cancer Mother   ? Alcohol abuse Father   ? Alcoholism Father   ? Depression Father   ? Post-traumatic stress disorder Father   ? Depression Sister   ? Depression Brother   ? Diabetes Maternal Grandfather   ? Renal Disease Maternal Grandfather   ? Breast cancer Maternal Grandmother 60  ? ? ?Social History:  ?Social History  ? ?Socioeconomic History  ? Marital status: Married  ?  Spouse name: jack  ? Number of children: 1  ? Years of education: Not on file  ? Highest education level: Bachelor's degree (e.g., BA, AB, BS)   ?Occupational History  ? Not on file  ?Tobacco Use  ? Smoking status: Some Days  ?  Types: Cigarettes  ? Smokeless tobacco: Never  ?Vaping Use  ? Vaping Use: Some days  ?Substance and Sexual Activity  ? Alcohol use: Yes  ?  Alcohol/week: 3.0 standard drinks  ?  Types: 3 Glasses of wine per week  ?  Comment: occass  ? Drug use: Yes  ?  Types: Marijuana  ?  Comment: daily  ? Sexual activity: Yes  ?  Birth control/protection: I.U.D.  ?Other Topics Concern  ? Not on file  ?Social History Narrative  ? Not on file  ? ?Social Determinants of Health  ? ?Financial Resource Strain: Not on file  ?Food Insecurity: Not on file  ?Transportation Needs: Not on file  ?Physical Activity: Not on file  ?Stress: Not on file  ?Social Connections: Not on file  ? ? ?Allergies:  ?Allergies  ?Allergen Reactions  ? Pineapple Itching  ?  Throat itching   ? Doxycycline Itching  ?  All over itching, no hives  ? ? ?Metabolic Disorder Labs: ?No results found for: HGBA1C, MPG ?Lab Results  ?Component Value Date  ? PROLACTIN 9.1 12/20/2019  ? ?No results found for: CHOL, TRIG, HDL, CHOLHDL, VLDL, LDLCALC ?Lab Results  ?Component Value Date  ? TSH 0.909 12/20/2019  ? ? ?Therapeutic Level Labs: ?No results found for: LITHIUM ?No results found for: VALPROATE ?No components found for:  CBMZ ? ?Current Medications: ?Current Outpatient Medications  ?Medication Sig Dispense Refill  ? [START ON 01/27/2022] ARIPiprazole (ABILIFY) 5 MG tablet Take 1 tablet (5 mg total) by mouth daily. 30 tablet 1  ? [START ON 01/14/2022] citalopram (CELEXA) 40 MG tablet Take 1 tablet (40 mg total) by mouth daily. 90 tablet 0  ? levonorgestrel (MIRENA) 20 MCG/24HR IUD 1 each by Intrauterine route once.    ? ?No current facility-administered medications for this visit.  ? ? ? ?Musculoskeletal: ?Strength & Muscle Tone:  N/A ?Gait & Station:  N/A ?Patient leans: N/A ? ?Psychiatric Specialty Exam: ?Review of Systems  ?Psychiatric/Behavioral:  Positive for dysphoric mood and sleep  disturbance. Negative for agitation, behavioral problems, confusion, decreased concentration, hallucinations, self-injury and suicidal ideas. The patient is nervous/anxious. The patient is not hyperactive.   ?All other systems reviewed and are negative.  ?currently breastfeeding.There is no height or weight on file to calculate BMI.  ?General Appearance: Fairly Groomed  ?Eye Contact:  Good  ?Speech:  Clear and Coherent  ?Volume:  Normal  ?Mood:   good  ?Affect:  Appropriate, Congruent, and euthymic  ?Thought Process:  Coherent  ?Orientation:  Full (Time, Place, and Person)  ?Thought Content: Logical   ?Suicidal Thoughts:  No  ?Homicidal Thoughts:  No  ?Memory:  Immediate;   Good  ?Judgement:  Good  ?Insight:  Good  ?Psychomotor Activity:  Normal  ?Concentration:  Concentration: Good and Attention Span: Good  ?Recall:  Good  ?Fund of Knowledge: Good  ?Language: Good  ?Akathisia:  No  ?Handed:  Right  ?AIMS (if indicated): not done  ?Assets:  Communication Skills ?Desire for Improvement  ?ADL's:  Intact  ?Cognition: WNL  ?Sleep:  Good  ? ?Screenings: ?PHQ2-9   ? ?Flowsheet Row [facility] Visit from [facility]  ?PHQ-2 Total Score 2 4 1 4   ?PHQ-9 Total Score 9 19 4 14   ? ?  ? ?Flowsheet Row Video [facility]/27[facility]2021 [facility] Visit from [facility]  ?C-SSRS RISK CATEGORY Error: Q3, 4, or 5 should not be populated when Q2 is No Error: Q7 should not be populated when Q6 is No  ? ?  ? ? ? ?Assessment and Plan:  ?Tymara Royetta AsalL Draeger is a 31 y.o. year old female with a history of depression, anxiety, who presents for follow up appointment for below.  ?  ? ?1. Mild episode of recurrent major depressive disorder (HCC) ?2. PTSD  (post-traumatic stress disorder) ?Although she reports slight worsening in depressive symptoms in the context of fracture in her arm, it has been improving since then. Other psychosocial stressors includes recent inci

## 2022-01-08 ENCOUNTER — Encounter: Payer: Self-pay | Admitting: Obstetrics and Gynecology

## 2022-01-08 ENCOUNTER — Telehealth (INDEPENDENT_AMBULATORY_CARE_PROVIDER_SITE_OTHER): Payer: 59 | Admitting: Psychiatry

## 2022-01-08 ENCOUNTER — Encounter: Payer: Self-pay | Admitting: Psychiatry

## 2022-01-08 DIAGNOSIS — F4310 Post-traumatic stress disorder, unspecified: Secondary | ICD-10-CM | POA: Diagnosis not present

## 2022-01-08 DIAGNOSIS — F330 Major depressive disorder, recurrent, mild: Secondary | ICD-10-CM

## 2022-01-08 DIAGNOSIS — R69 Illness, unspecified: Secondary | ICD-10-CM | POA: Diagnosis not present

## 2022-01-08 MED ORDER — ARIPIPRAZOLE 5 MG PO TABS: 5.0000 mg | ORAL_TABLET | Freq: Every day | ORAL | 1 refills | Status: DC

## 2022-01-08 MED ORDER — CITALOPRAM HYDROBROMIDE 40 MG PO TABS: 40.0000 mg | ORAL_TABLET | Freq: Every day | ORAL | 0 refills | Status: DC

## 2022-01-08 NOTE — Patient Instructions (Signed)
Continue citalopram 40 mg daily ?Continue Abilify 5 mg daily ?Next appointment: 6/1 at 11 AM, in person ?Obtain lab/TSH ? ?The next visit will be in person visit. Please arrive 15 mins before the scheduled time.  ? ?[facility]  ?Address: [address], [place], [place] [address]   ?

## 2022-01-09 DIAGNOSIS — S52122A Displaced fracture of head of left radius, initial encounter for closed fracture: Secondary | ICD-10-CM | POA: Diagnosis not present

## 2022-02-05 DIAGNOSIS — S52122A Displaced fracture of head of left radius, initial encounter for closed fracture: Secondary | ICD-10-CM | POA: Diagnosis not present
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
## 2022-03-05 ENCOUNTER — Ambulatory Visit: Payer: 59 | Admitting: Psychiatry
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
## 2022-03-06 ENCOUNTER — Telehealth: Payer: 59 | Admitting: Psychiatry

## 2022-03-06 ENCOUNTER — Telehealth: Payer: Self-pay | Admitting: Psychiatry

## 2022-03-06 NOTE — Telephone Encounter (Signed)
Sent link for video visit through Epic. Patient did not sign in. Called the patient for appointment scheduled today. The patient did not answer the phone. Left voice message to contact the office ([phone]).   ?

## 2022-03-29 ENCOUNTER — Encounter (HOSPITAL_COMMUNITY): Payer: Self-pay | Admitting: Emergency Medicine

## 2022-03-29 ENCOUNTER — Ambulatory Visit (HOSPITAL_COMMUNITY)
Admission: EM | Admit: 2022-03-29 | Discharge: 2022-03-29 | Disposition: A | Discharge status: Home / Self Care | Payer: 59 | Attending: Emergency Medicine | Admitting: Emergency Medicine

## 2022-03-29 DIAGNOSIS — U071 COVID-19: Secondary | ICD-10-CM

## 2022-03-29 MED ORDER — ALBUTEROL SULFATE HFA 108 (90 BASE) MCG/ACT IN AERS: 2.0000 | INHALATION_SPRAY | Freq: Four times a day (QID) | RESPIRATORY_TRACT | 0 refills | Status: DC | PRN

## 2022-03-29 MED ORDER — PREDNISONE 20 MG PO TABS: 40.0000 mg | ORAL_TABLET | Freq: Every day | ORAL | 0 refills | Status: DC

## 2022-03-29 NOTE — ED Provider Notes (Signed)
[facility]    CSN: [id number] Arrival date & time: 03/29/22  1715      History   Chief Complaint Chief Complaint  Patient presents with   Covid Positive    HPI Barbara [name] is a 31 y.o. female.   Patient presents with chills, fever, nasal congestion, bilateral ear fullness, rhinorrhea, sore throat and nonproductive cough, shortness of breath, wheezing and diarrhea for 3 days.  Last episode of diarrhea 2 days ago.  Home COVID test positive last night.  No known sick contacts.  Tolerating food and liquids.  Has attempted use of her husband's albuterol inhaler which has been effective.  Attempted Nettie pot and antihistamine in addition.     Past Medical History:  Diagnosis Date   Anxiety    Depression    History of Papanicolaou smear of cervix 11/06/14; 02/06/16   NEG-CT POS; NEG - CT/GC/TR NEG;   STD (sexually transmitted disease)    chlamydia    Patient Active Problem List   Diagnosis Date Noted   Increased BMI 08/01/2018   Anxiety and depression 01/26/2017    Past Surgical History:  Procedure Laterality Date   elective abortion  11/2013   [facility]    OB History     Gravida  2   Para  1   Term  1   Preterm      AB  1   Living  1      SAB      IAB  1   Ectopic      Multiple  0   Live Births  1            Home Medications    Prior to Admission medications   Medication Sig Start Date End Date Taking? Authorizing Provider  ARIPiprazole (ABILIFY) 5 MG tablet Take 1 tablet (5 mg total) by mouth daily. 01/27/22 03/28/22  Neysa Hotter, MD  citalopram (CELEXA) 40 MG tablet Take 1 tablet (40 mg total) by mouth daily. 01/14/22 04/14/22  Neysa Hotter, MD  levonorgestrel (MIRENA) 20 MCG/24HR IUD 1 each by Intrauterine route once.    [provider]    Family History Family History  Problem Relation Age of Onset   Depression Mother    Endometrial cancer Mother    Alcohol abuse Father    Alcoholism  Father    Depression Father    Post-traumatic stress disorder Father    Depression Sister    Depression Brother    Diabetes Maternal Grandfather    Renal Disease Maternal Grandfather    Breast cancer Maternal Grandmother 34    Social History Social History   Tobacco Use   Smoking status: Some Days    Types: Cigarettes   Smokeless tobacco: Never  Vaping Use   Vaping Use: Some days  Substance Use Topics   Alcohol use: Yes    Alcohol/week: 3.0 standard drinks of alcohol    Types: 3 Glasses of wine per week    Comment: occass   Drug use: Yes    Types: Marijuana    Comment: daily     Allergies   Pineapple and Doxycycline   Review of Systems Review of Systems  Constitutional:  Positive for chills and fever. Negative for activity change, appetite change, diaphoresis, fatigue and unexpected weight change.  HENT:  Positive for congestion, ear pain, rhinorrhea and sore throat. Negative for dental problem, drooling, ear discharge, facial swelling, hearing loss, mouth sores, nosebleeds, postnasal drip, sinus  pressure, sinus pain, sneezing, tinnitus, trouble swallowing and voice change.   Respiratory:  Positive for cough, shortness of breath and wheezing. Negative for apnea, choking, chest tightness and stridor.   Cardiovascular: Negative.   Gastrointestinal:  Positive for diarrhea. Negative for abdominal distention, abdominal pain, anal bleeding, blood in stool, constipation, nausea, rectal pain and vomiting.  Skin: Negative.   Neurological: Negative.      Physical Exam Triage Vital Signs ED Triage Vitals  Enc Vitals Group     BP 03/29/22 1735 (!) 105/56     Pulse Rate 03/29/22 1735 (!) 108     Resp 03/29/22 1735 18     Temp 03/29/22 1735 98.7 F (37.1 C)     Temp Source 03/29/22 1735 Oral     SpO2 03/29/22 1735 97 %     Weight --      Height --      Head Circumference --      Peak Flow --      Pain Score 03/29/22 1733 0     Pain Loc --      Pain Edu? --      Excl.  in GC? --    No data found.  Updated Vital Signs BP (!) 105/56 (BP Location: Left Arm)   Pulse (!) 108   Temp 98.7 F (37.1 C) (Oral)   Resp 18   SpO2 97%   Visual Acuity Right Eye Distance:   Left Eye Distance:   Bilateral Distance:    Right Eye Near:   Left Eye Near:    Bilateral Near:     Physical Exam Constitutional:      Appearance: She is ill-appearing.  HENT:     Head: Normocephalic.     Right Ear: Tympanic membrane, ear canal and external ear normal.     Left Ear: Tympanic membrane, ear canal and external ear normal.     Nose: Congestion and rhinorrhea present.     Mouth/Throat:     Mouth: Mucous membranes are moist.     Pharynx: Oropharynx is clear.  Eyes:     Extraocular Movements: Extraocular movements intact.  Cardiovascular:     Rate and Rhythm: Normal rate and regular rhythm.     Pulses: Normal pulses.     Heart sounds: Normal heart sounds.  Pulmonary:     Effort: Pulmonary effort is normal.     Breath sounds: Normal breath sounds.  Skin:    General: Skin is warm and dry.  Neurological:     Mental Status: She is alert and oriented to person, place, and time. Mental status is at baseline.  Psychiatric:        Mood and Affect: Mood normal.        Behavior: Behavior normal.      UC Treatments / Results  Labs (all labs ordered are listed, but only abnormal results are displayed) Labs Reviewed - No data to display  EKG   Radiology No results found.  Procedures Procedures (including critical care time)  Medications Ordered in UC Medications - No data to display  Initial Impression / Assessment and Plan / UC Course  I have reviewed the triage vital signs and the nursing notes.  Pertinent labs & imaging results that were available during my care of the patient were reviewed by me and considered in my medical decision making (see chart for details).  COVID-19  PCR for work pending, vital signs are stable, O2 saturation 97% on room air,  lungs  are clear to auscultation, prescribed prednisone burst and albuterol inhaler for home use, may use additional over-the-counter medications as needed for supportive care, may follow-up with urgent care as needed, work note given Final Clinical Impressions(s) / UC Diagnoses   Final diagnoses:  None   Discharge Instructions   None    ED Prescriptions   None    PDMP not reviewed this encounter.   Valinda Hoar, [place] 04/01/22 [phone]

## 2022-03-30 LAB — SARS CORONAVIRUS 2 (TAT 6-24 HRS): SARS Coronavirus 2: POSITIVE — AB

## 2022-04-09 ENCOUNTER — Encounter: Payer: Self-pay | Admitting: Obstetrics and Gynecology
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
## 2022-05-14 ENCOUNTER — Encounter: Payer: Self-pay | Admitting: Psychiatry

## 2022-05-14 ENCOUNTER — Ambulatory Visit (INDEPENDENT_AMBULATORY_CARE_PROVIDER_SITE_OTHER): Payer: 59 | Admitting: Psychiatry

## 2022-05-14 VITALS — BP 115/68 | HR 84 | Temp 98.3°F | Wt 235.4 lb

## 2022-05-14 DIAGNOSIS — F332 Major depressive disorder, recurrent severe without psychotic features: Secondary | ICD-10-CM | POA: Diagnosis not present

## 2022-05-14 DIAGNOSIS — F4310 Post-traumatic stress disorder, unspecified: Secondary | ICD-10-CM

## 2022-05-14 MED ORDER — FLUOXETINE HCL 20 MG PO CAPS: 20.0000 mg | ORAL_CAPSULE | Freq: Every day | ORAL | 0 refills | Status: DC

## 2022-05-14 MED ORDER — ARIPIPRAZOLE 5 MG PO TABS: 5.0000 mg | ORAL_TABLET | Freq: Every day | ORAL | 0 refills | Status: DC
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
## 2022-06-10 ENCOUNTER — Encounter: Payer: Self-pay | Admitting: Psychiatry

## 2022-06-10 ENCOUNTER — Telehealth (INDEPENDENT_AMBULATORY_CARE_PROVIDER_SITE_OTHER): Payer: 59 | Admitting: Psychiatry

## 2022-06-10 DIAGNOSIS — F332 Major depressive disorder, recurrent severe without psychotic features: Secondary | ICD-10-CM | POA: Diagnosis not present

## 2022-06-10 DIAGNOSIS — F4310 Post-traumatic stress disorder, unspecified: Secondary | ICD-10-CM

## 2022-06-10 MED ORDER — FLUOXETINE HCL 40 MG PO CAPS: 40.0000 mg | ORAL_CAPSULE | Freq: Every day | ORAL | 1 refills | Status: DC

## 2022-06-10 MED ORDER — ARIPIPRAZOLE 5 MG PO TABS: 5.0000 mg | ORAL_TABLET | Freq: Every day | ORAL | 1 refills | Status: DC
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
## 2022-07-27 ENCOUNTER — Other Ambulatory Visit: Payer: Self-pay | Admitting: Psychiatry

## 2022-07-27 ENCOUNTER — Ambulatory Visit: Payer: 59 | Admitting: Psychiatry

## 2022-07-27 MED ORDER — ARIPIPRAZOLE 5 MG PO TABS: 5.0000 mg | ORAL_TABLET | Freq: Every day | ORAL | 0 refills | Status: DC

## 2022-07-27 MED ORDER — FLUOXETINE HCL 40 MG PO CAPS: 40.0000 mg | ORAL_CAPSULE | Freq: Every day | ORAL | 0 refills | Status: DC

## 2022-12-14 IMAGING — US US BREAST*R* LIMITED INC AXILLA
1 series · 6 of 6 positions shown · non-contrast
Comparison: None.

CLINICAL DATA: 29-year-old female presenting for evaluation of
right nipple discharge since she stopped nursing 2 years ago. She is
only noted the discharge coming from 1 duct, and can be white,
yellow or green. She has had new burning sensation in the left
nipple for 2 weeks.

EXAM:
ULTRASOUND OF THE BILATERAL BREAST

[Series 1: us breast*right* limited inc axilla · 0.05mm/px · 6 of 6 slices shown]
[im 1/6]
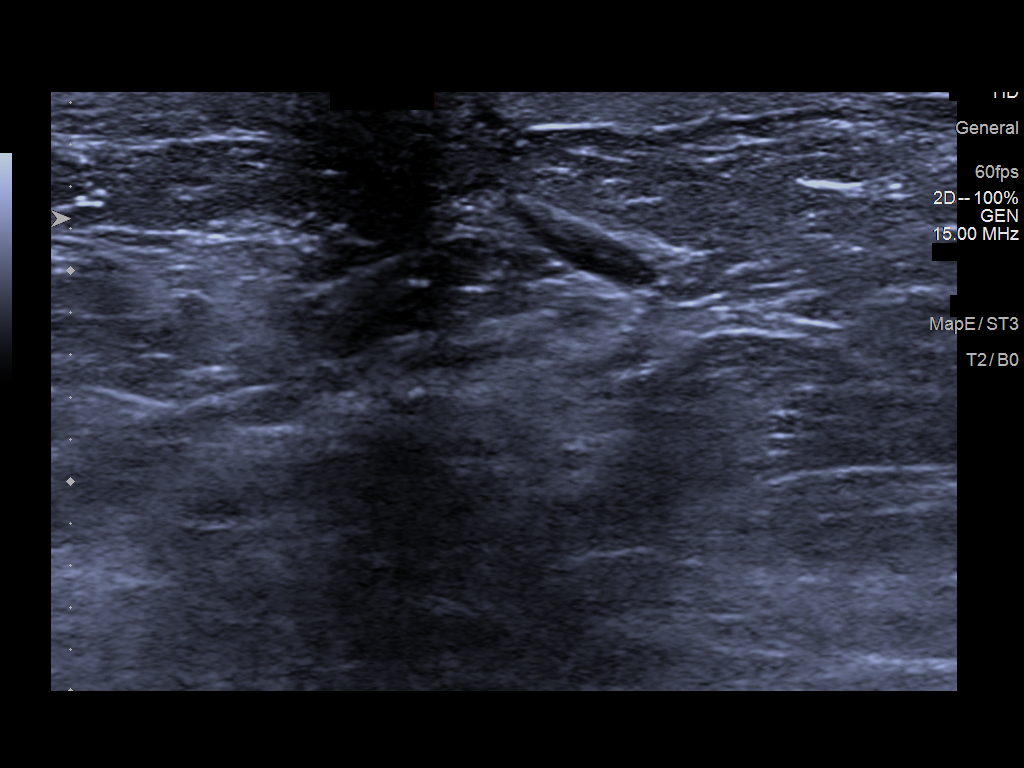
[im 2/6]
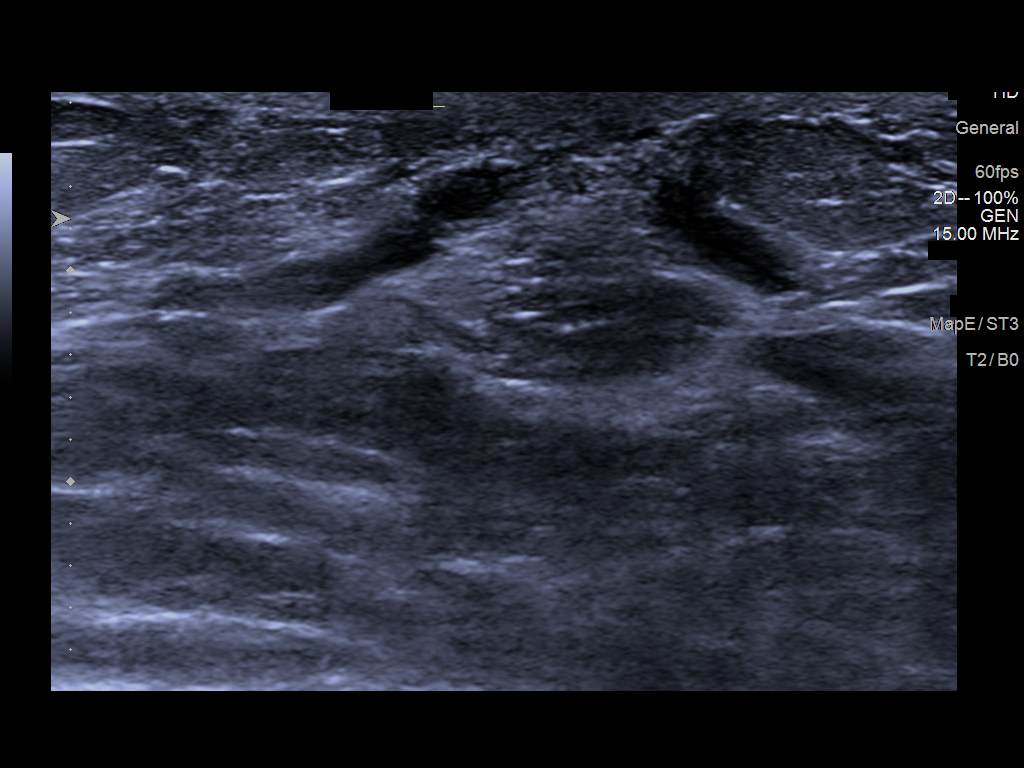
[im 3/6]
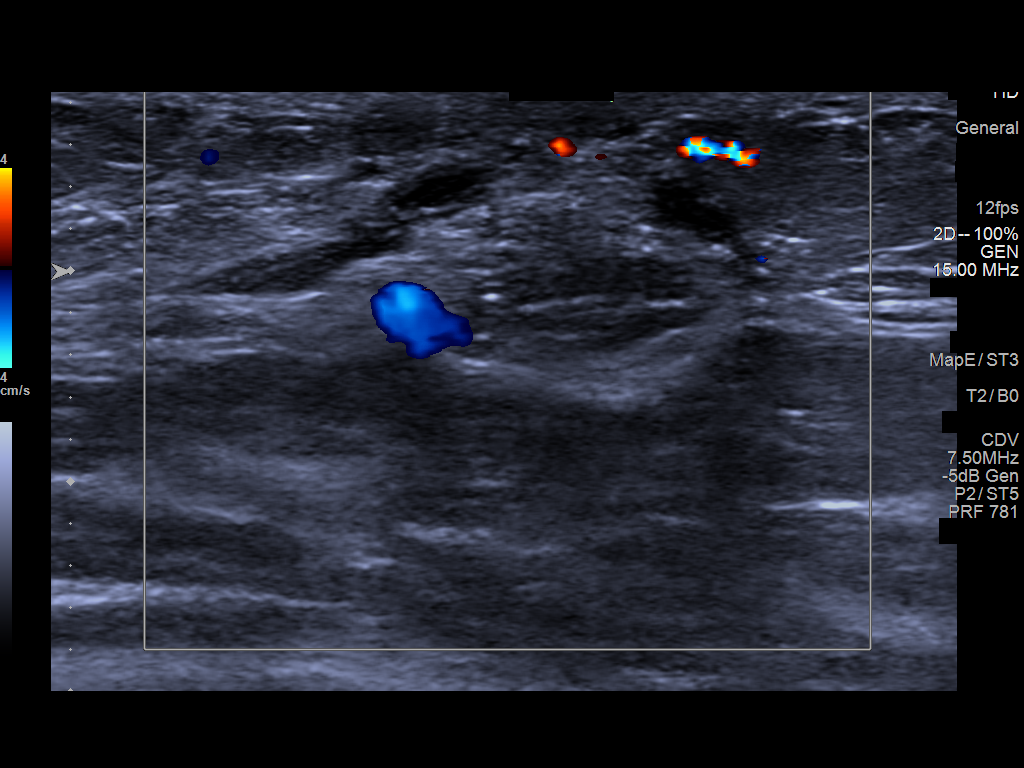
[im 4/6]
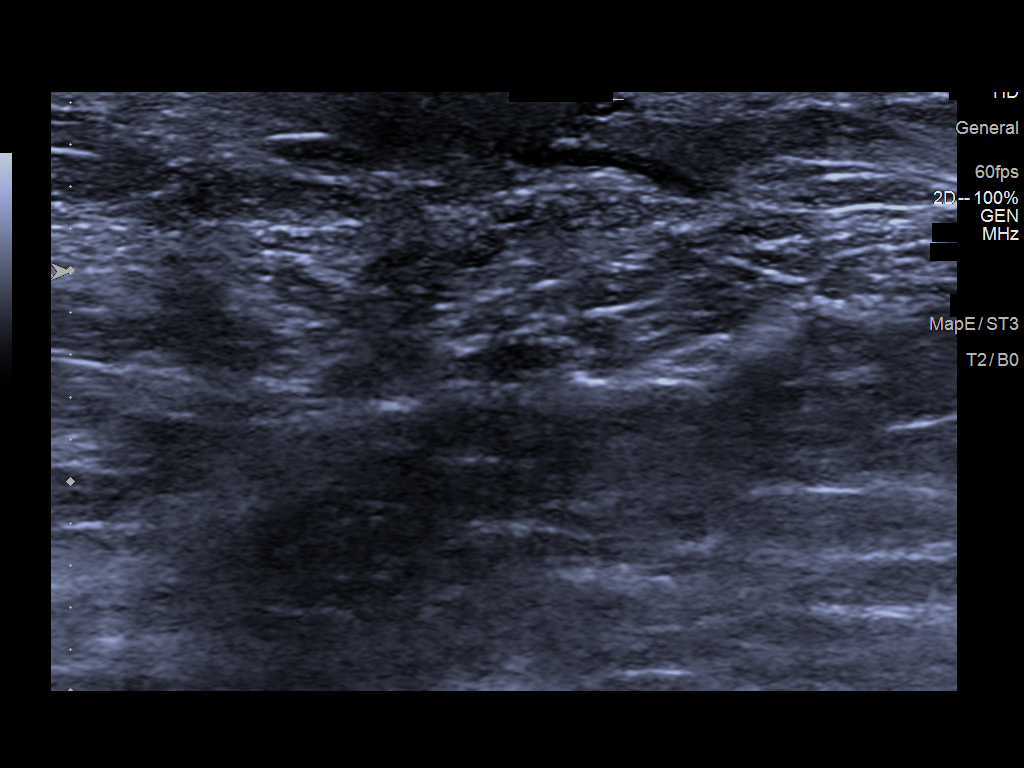
[im 5/6]
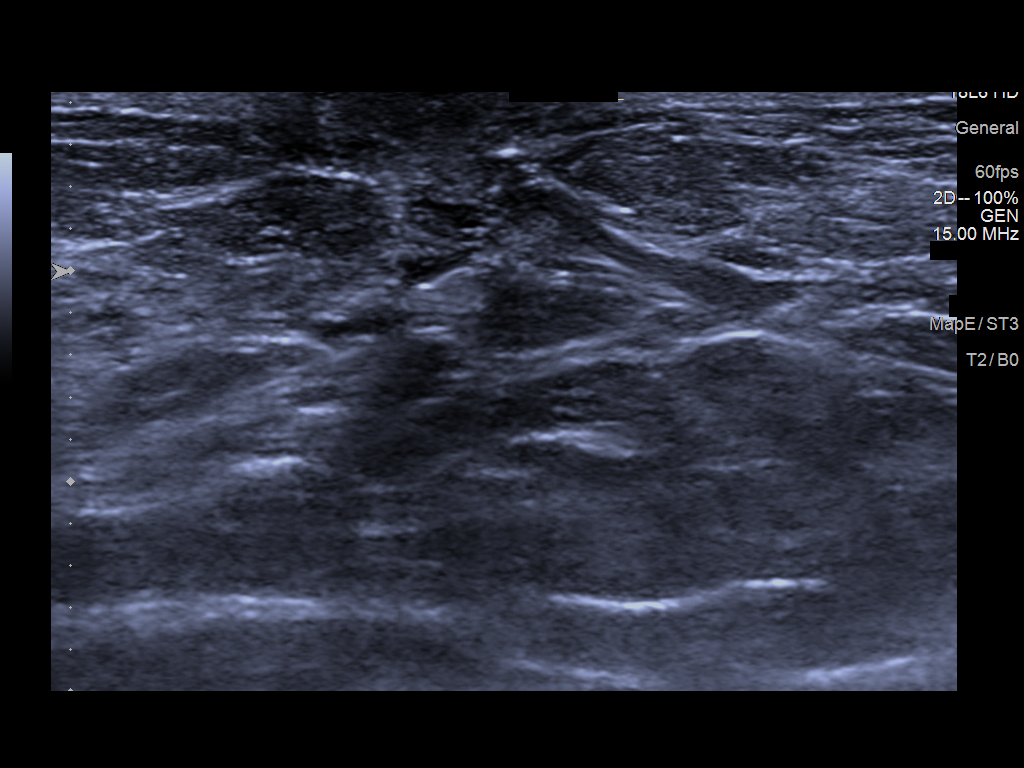
[im 6/6]
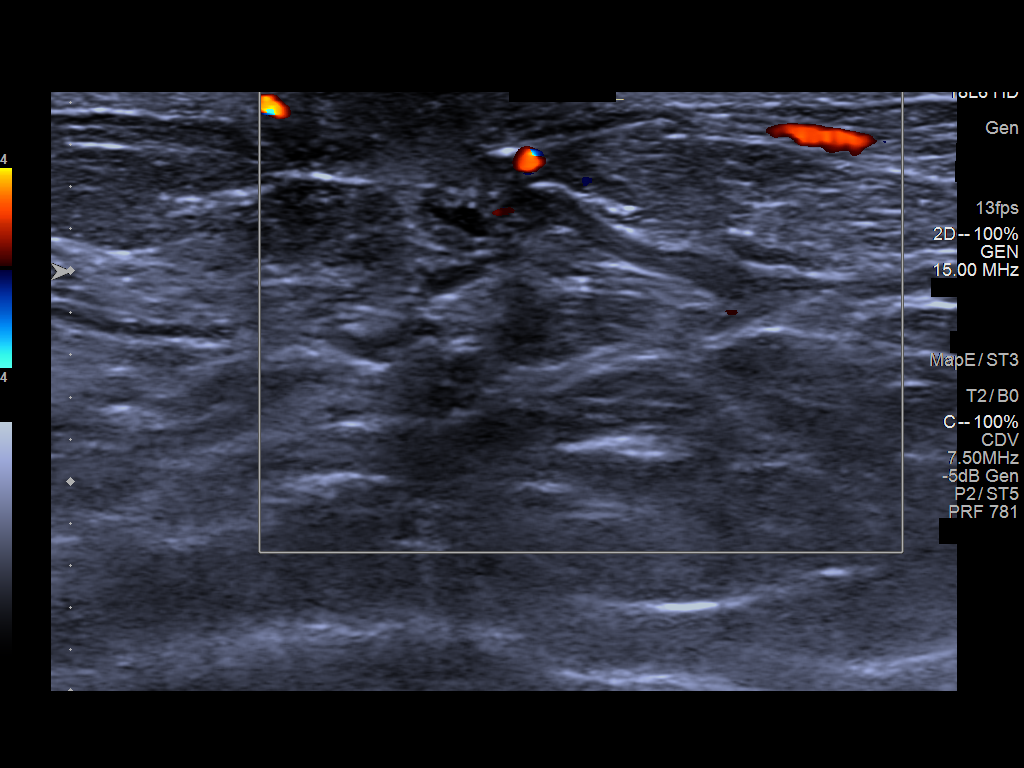

[6 of 6 positions shown; findings below may reference images not displayed]

FINDINGS: On physical exam, multi duct discharge can be elicited from the
right nipple with manual expression. No bloody discharge is
identified. There is milky colored discharge from 1 central duct,
and dark greenish to yellow colors from other ducts.

Targeted ultrasound is performed, showing multiple mildly prominent
ducts in the retroareolar right breast. Mobile fluid can be seen
within some of these ducts, while others arcs banded with simple
fluid. No intraductal masses are identified.

Targeted ultrasound of the retroareolar left breast demonstrates
normal fibroglandular tissue. One duct with mobile fluid is
identified in the retroareolar left breast.
IMPRESSION: Mildly prominent ducts bilaterally. No intraductal or parenchymal
masses are identified.

RECOMMENDATION:
Clinical follow-up recommended for the right nipple discharge and
left nipple burning sensation. Any further workup should be based on
clinical grounds.

I have discussed the findings and recommendations with the patient.
If applicable, a reminder letter will be sent to the patient
regarding the next appointment.

BI-RADS CATEGORY  Screening mammogram at age 40 unless there are
persistent or intervening clinical concerns. (Code:IK-Y-OB5)

## 2022-12-14 IMAGING — US US BREAST*L* LIMITED INC AXILLA
1 series · 8 of 8 positions shown · non-contrast
Comparison: None.

CLINICAL DATA: 29-year-old female presenting for evaluation of
right nipple discharge since she stopped nursing 2 years ago. She is
only noted the discharge coming from 1 duct, and can be white,
yellow or green. She has had new burning sensation in the left
nipple for 2 weeks.

EXAM:
ULTRASOUND OF THE BILATERAL BREAST

[Series 1: us breast*left* limited inc axilla · 0.07mm/px · 8 of 8 slices shown]
[im 1/8]
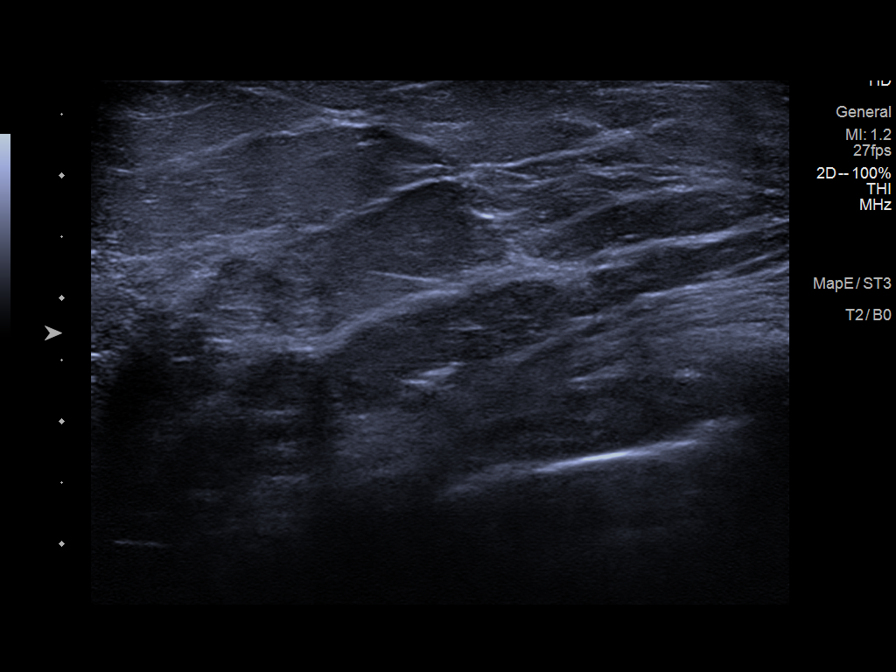
[im 2/8]
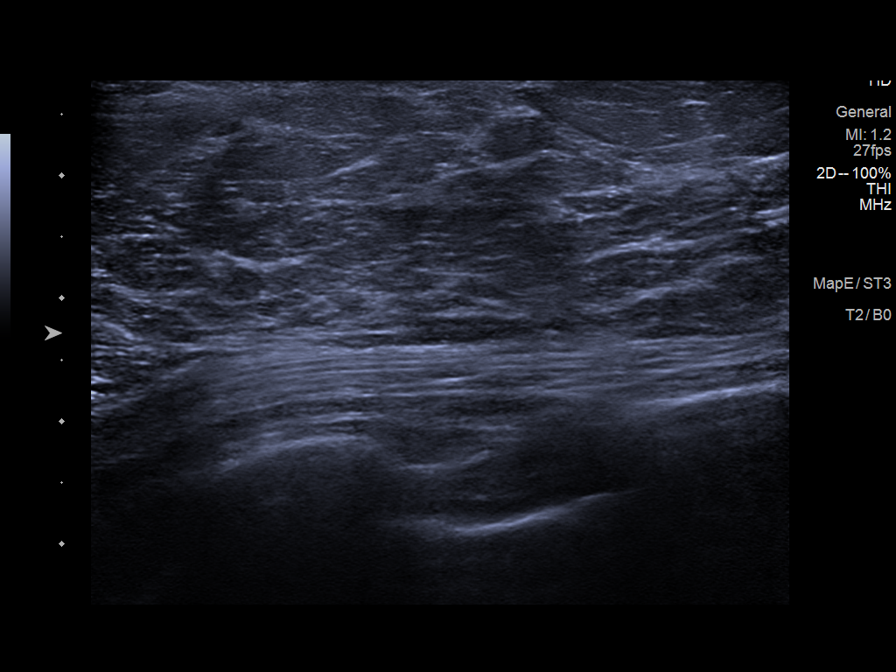
[im 3/8]
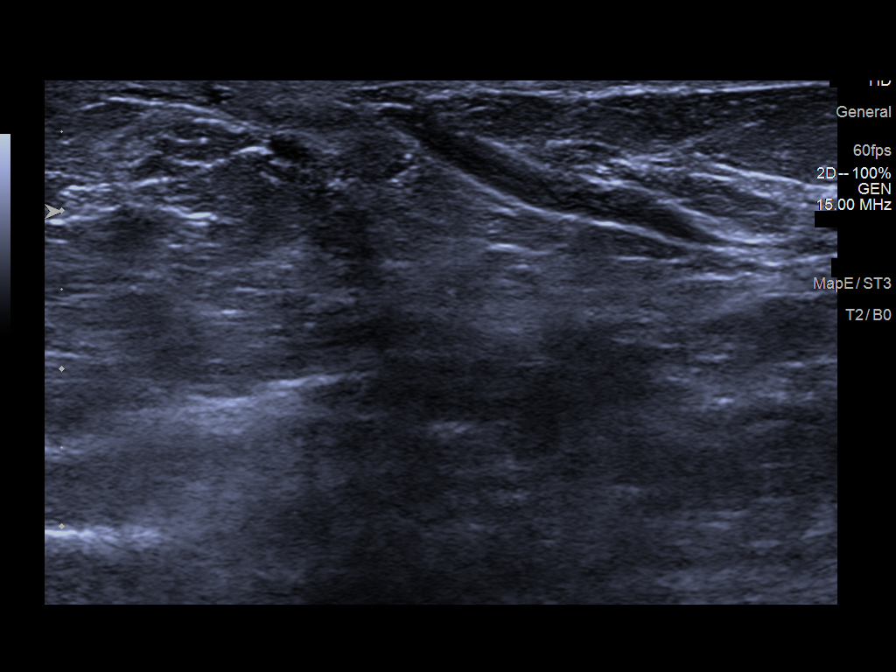
[im 4/8]
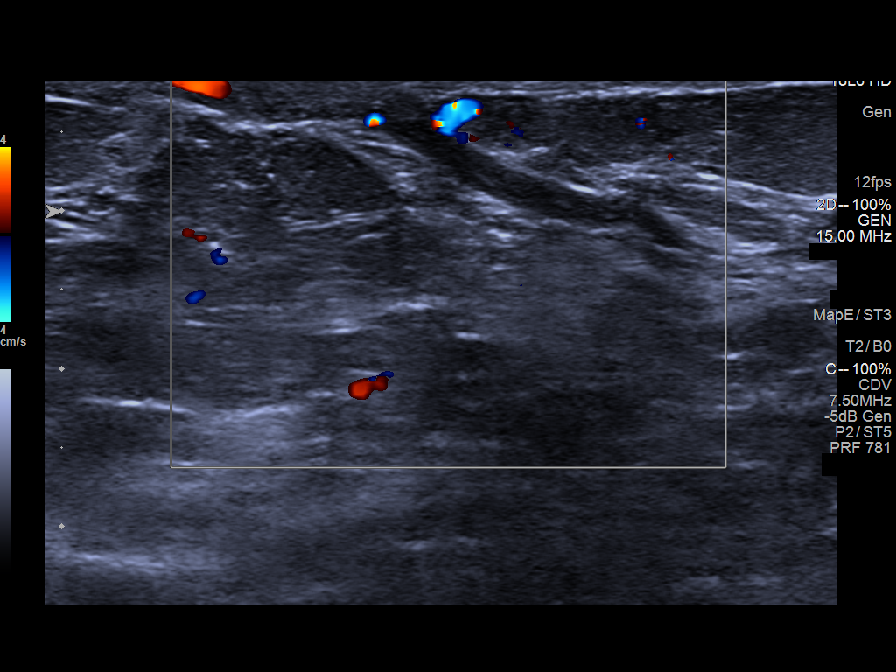
[im 5/8]
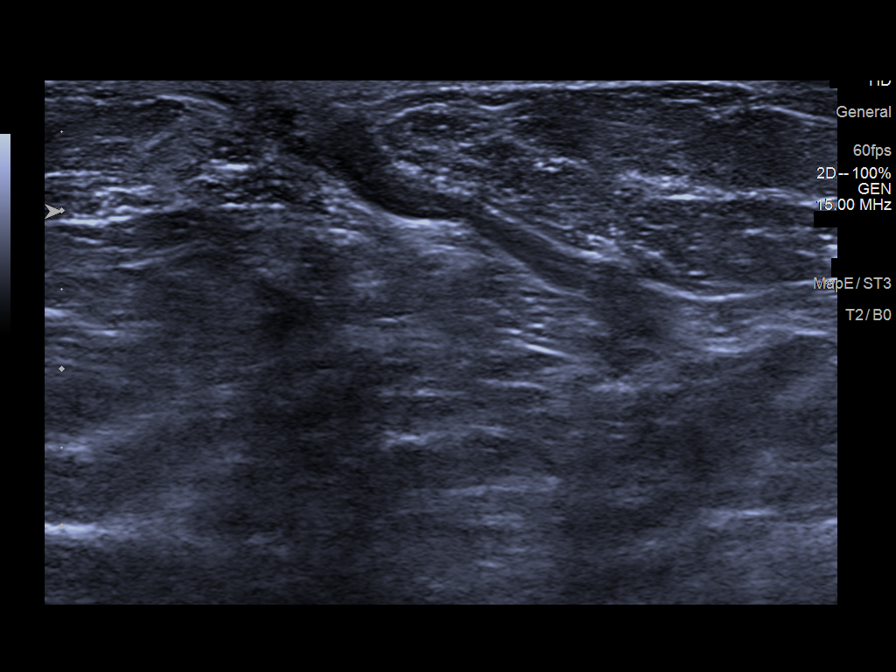
[im 6/8]
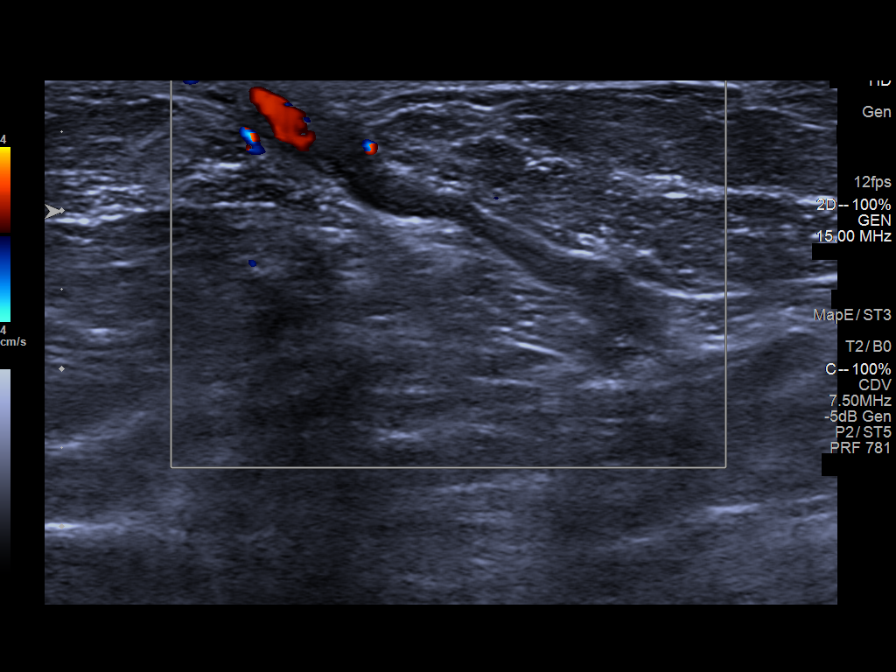
[im 7/8]
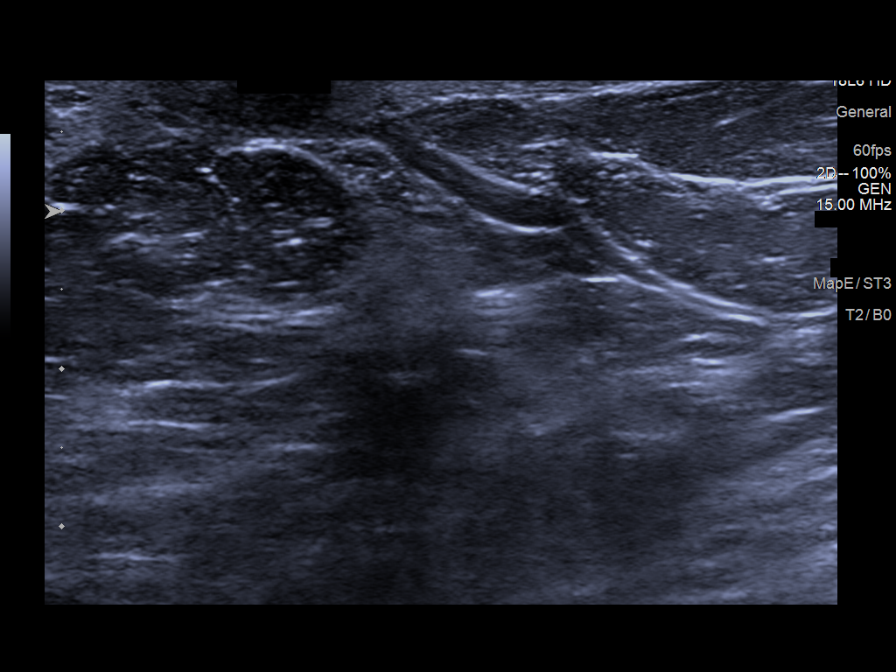
[im 8/8]
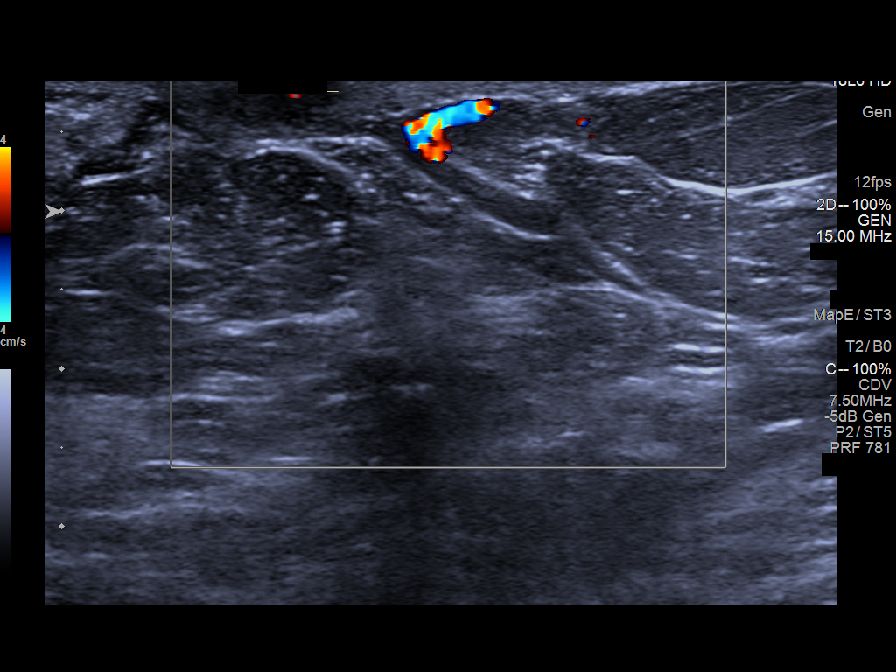

[8 of 8 positions shown; findings below may reference images not displayed]

FINDINGS: On physical exam, multi duct discharge can be elicited from the
right nipple with manual expression. No bloody discharge is
identified. There is milky colored discharge from 1 central duct,
and dark greenish to yellow colors from other ducts.

Targeted ultrasound is performed, showing multiple mildly prominent
ducts in the retroareolar right breast. Mobile fluid can be seen
within some of these ducts, while others arcs banded with simple
fluid. No intraductal masses are identified.

Targeted ultrasound of the retroareolar left breast demonstrates
normal fibroglandular tissue. One duct with mobile fluid is
identified in the retroareolar left breast.
IMPRESSION: Mildly prominent ducts bilaterally. No intraductal or parenchymal
masses are identified.

RECOMMENDATION:
Clinical follow-up recommended for the right nipple discharge and
left nipple burning sensation. Any further workup should be based on
clinical grounds.

I have discussed the findings and recommendations with the patient.
If applicable, a reminder letter will be sent to the patient
regarding the next appointment.

BI-RADS CATEGORY  Screening mammogram at age 40 unless there are
persistent or intervening clinical concerns. (Code:IK-Y-OB5)

## 2023-02-03 ENCOUNTER — Telehealth: Payer: Self-pay

## 2023-02-03 NOTE — Telephone Encounter (Addendum)
Pt called triage and said she needs to be seen due to abdominal/pelvic pain. She was able to schedule an appt for the end of May but she can't wait till then. She was just at the ER a couple of weeks ago for constipation and she is still having trouble with it. I advised to go to her PCP and she said she did a couple of weeks ago and what they advised is not working. I advised to try them again so they can hopefully go to another plan. She said she has been bleeding for two weeks. I advised to take a pregnancy test first thing in t the morning to rule that out. She she said she took two pregnancy tests 1 week ago so I advised to take another one tomorrow first thing in the morning. I also advised to call our office in the mornings to see if there was a same day appt. Advised she should go to ER for severe pelvic pain.

## 2023-02-04 ENCOUNTER — Encounter: Payer: Self-pay | Admitting: Obstetrics and Gynecology

## 2023-02-04 ENCOUNTER — Ambulatory Visit (INDEPENDENT_AMBULATORY_CARE_PROVIDER_SITE_OTHER): Payer: 59 | Admitting: Obstetrics and Gynecology

## 2023-02-04 VITALS — BP 110/70 | Ht 66.0 in | Wt 234.0 lb

## 2023-02-04 DIAGNOSIS — L292 Pruritus vulvae: Secondary | ICD-10-CM | POA: Diagnosis not present

## 2023-02-04 DIAGNOSIS — R1032 Left lower quadrant pain: Secondary | ICD-10-CM | POA: Diagnosis not present

## 2023-02-04 DIAGNOSIS — Z3202 Encounter for pregnancy test, result negative: Secondary | ICD-10-CM | POA: Diagnosis not present

## 2023-02-04 DIAGNOSIS — Z30431 Encounter for routine checking of intrauterine contraceptive device: Secondary | ICD-10-CM

## 2023-02-04 LAB — POCT URINE PREGNANCY: Preg Test, Ur: NEGATIVE

## 2023-02-04 NOTE — Patient Instructions (Signed)
I value your feedback and you entrusting us with your care. If you get a [facility] patient survey, I would appreciate you taking the time to let us know about your experience today. Thank you! ? ? ?

## 2023-02-04 NOTE — Progress Notes (Signed)
Luciana Axe, NP   Chief Complaint  Patient presents with   Vaginal Bleeding    Entire area, more on the left side x 2-3 weeks. Constipation    HPI:      Barbara [name] is a 32 y.o. G2P1011 whose LMP was No LMP recorded. (Menstrual status: IUD)., presents today for almost daily BTB with IUD for the past 2 wks with light flow. Had neg UPT. Usually amenorrheic. Mirena inserterd 12/19. Pt also with LLQ pain for past 1 1/2-2 months. Went to ED 01/16/23 due to severe LLQ pain, felt like squeezing/tightness/strong cramp. Xray showed stool, diagnosed with constipation. Told to do OTC meds. Took a few days before BM. Is seeing PCP for new onset constipation. LLQ is still present just not as bad. Has tried heating pad/NSAIDs with some relief.  Pt was doing protein shake that she thinks might be contributing to GI sx.  Pt is sexually active, no new partners. Has had some vag dryness recently improved with lubricants.  Has a several yr hx of intermittent ext vag itching. Pt scratches to the point of bleeding at times. Doesn't shave/wax. Using dove sens skin soap, no dryer sheets, uses cotton regular underwear. Treats with monistat internally and ext with sx relief temporarily.   Patient Active Problem List   Diagnosis Date Noted   Increased BMI 08/01/2018   Anxiety and depression 01/26/2017    Past Surgical History:  Procedure Laterality Date   elective abortion  11/2013   [facility]    Family History  Problem Relation Age of Onset   Depression Mother    Endometrial cancer Mother    Alcohol abuse Father    Alcoholism Father    Depression Father    Post-traumatic stress disorder Father    Depression Sister    Depression Brother    Breast cancer Maternal Grandmother 30   Diabetes Maternal Grandfather    Renal Disease Maternal Grandfather     Social History   Socioeconomic History   Marital status: Married    Spouse name: Wellsite geologist   Number of children: 1    Years of education: Not on file   Highest education level: Bachelor's degree (e.g., BA, AB, BS)  Occupational History   Not on file  Tobacco Use   Smoking status: Former    Types: Cigarettes   Smokeless tobacco: Never  Vaping Use   Vaping Use: Former  Substance and Sexual Activity   Alcohol use: Yes    Alcohol/week: 3.0 standard drinks of alcohol    Types: 3 Glasses of wine per week    Comment: occass   Drug use: Not Currently   Sexual activity: Yes    Birth control/protection: I.U.D.    Comment: Mirena  Other Topics Concern   Not on file  Social History Narrative   Not on file   Social Determinants of Health   Financial Resource Strain: Low Risk  (07/15/2018)   Overall Financial Resource Strain (CARDIA)    Difficulty of Paying Living Expenses: Not hard at all  Food Insecurity: No Food Insecurity (07/15/2018)   Hunger Vital Sign    Worried About Running Out of Food in the Last Year: Never true    Ran Out of Food in the Last Year: Never true  Transportation Needs: No Transportation Needs (07/15/2018)   PRAPARE - Administrator, Civil Service (Medical): No    Lack of Transportation (Non-Medical): No  Physical Activity: Inactive (  07/15/2018)   Exercise Vital Sign    Days of Exercise per Week: 0 days    Minutes of Exercise per Session: 0 min  Stress: Not on file  Social Connections: Not on file  Intimate Partner Violence: Not on file    Outpatient Medications Prior to Visit  Medication Sig Dispense Refill   levonorgestrel (MIRENA) 20 MCG/24HR IUD 1 each by Intrauterine route once.     albuterol (VENTOLIN HFA) 108 (90 Base) MCG/ACT inhaler Inhale 2 puffs into the lungs every 6 (six) hours as needed for wheezing or shortness of breath. 18 g 0   ARIPiprazole (ABILIFY) 5 MG tablet Take 1 tablet (5 mg total) by mouth daily. 30 tablet 0   FLUoxetine (PROZAC) 20 MG capsule Take 1 capsule (20 mg total) by mouth daily. 30 capsule 0   FLUoxetine (PROZAC) 40 MG  capsule Take 1 capsule (40 mg total) by mouth daily. 30 capsule 0   No facility-administered medications prior to visit.      ROS:  Review of Systems  Constitutional:  Negative for fever.  Gastrointestinal:  Positive for constipation. Negative for blood in stool, diarrhea, nausea and vomiting.  Genitourinary:  Positive for menstrual problem and pelvic pain. Negative for dyspareunia, dysuria, flank pain, frequency, hematuria, urgency, vaginal bleeding, vaginal discharge and vaginal pain.  Musculoskeletal:  Negative for back pain.  Skin:  Negative for rash.   BREAST: No symptoms   OBJECTIVE:   Vitals:  BP 110/70   Ht 5\' 6"  (1.676 m)   Wt 234 lb (106.1 kg)   Breastfeeding No   BMI 37.77 kg/m   Physical Exam Vitals reviewed.  Constitutional:      Appearance: She is well-developed.  Pulmonary:     Effort: Pulmonary effort is normal.  Abdominal:     Palpations: Abdomen is soft.     Tenderness: There is no abdominal tenderness. There is no guarding or rebound.  Genitourinary:    General: Normal vulva.     Pubic Area: No rash.      Labia:        Right: No rash, tenderness or lesion.        Left: No rash, tenderness or lesion.      Vagina: Normal. No vaginal discharge, erythema or tenderness.     Cervix: No cervical motion tenderness.     Uterus: Normal. Tender. Not enlarged.      Adnexa:        Right: Tenderness present. No mass.         Left: Tenderness present. No mass.       Comments: NEG EXT EXAM; PT WITH NORMAL HAIR DISTRIBUTION/LENGTH; CERVIX TENDER TO TOUCH AT NABOTHIAN CYST; NO CMT Musculoskeletal:        General: Normal range of motion.     Cervical back: Normal range of motion.  Skin:    General: Skin is warm and dry.  Neurological:     General: No focal deficit present.     Mental Status: She is alert and oriented to person, place, and time.  Psychiatric:        Mood and Affect: Mood normal.        Behavior: Behavior normal.        Thought Content:  Thought content normal.        Judgment: Judgment normal.     Results: Results for orders placed or performed in visit on 02/04/23 (from the past 24 hour(s))  POCT urine pregnancy  Status: Normal   Collection Time: 02/04/23  9:52 AM  Result Value Ref Range   Preg Test, Ur Negative Negative     Assessment/Plan: LLQ pain - Plan: US PELVIS TRANSVAGINAL NON-OB (TV ONLY), POCT urine pregnancy; tender on exam, neg UPT. Chck GYN u/s. Discussed malposition IUD vs ovar cyst vs constipation. Will f/u with results.   Encounter for routine checking of intrauterine contraceptive device (IUD) - Plan: US PELVIS TRANSVAGINAL NON-OB (TV ONLY)  Breakthrough bleeding with IUD--check GYN u/s. If WNL, can be normal and f/u prn. If becomes excessive/frequent, can replace IUD early. Has 8 yr indication.   Vulvar itching--neg exam. Recommended pt trim pubic hair to help with moisture control in case cause of itching. Can eval further when sx. F/u prn.     Return in about 1 day (around 02/05/2023) for GYN u/s for LLQ pain/IUD placement--[facility] to call pt.  /RTO for annual  Barbara Jeansonne B. Samia Kukla, PA-C 02/04/2023 11:50 AM

## 2023-02-23 ENCOUNTER — Ambulatory Visit: Payer: 59 | Admitting: Obstetrics and Gynecology

## 2023-03-05 ENCOUNTER — Encounter: Payer: Self-pay | Admitting: Physician Assistant

## 2023-03-05 ENCOUNTER — Ambulatory Visit (INDEPENDENT_AMBULATORY_CARE_PROVIDER_SITE_OTHER): Payer: 59 | Admitting: Physician Assistant

## 2023-03-05 ENCOUNTER — Ambulatory Visit: Payer: 59

## 2023-03-05 ENCOUNTER — Ambulatory Visit: Payer: 59 | Admitting: Physician Assistant

## 2023-03-05 ENCOUNTER — Telehealth: Payer: Self-pay | Admitting: Obstetrics and Gynecology

## 2023-03-05 ENCOUNTER — Other Ambulatory Visit: Payer: 59

## 2023-03-05 VITALS — BP 121/83 | HR 100 | Temp 97.8°F | Ht 66.0 in | Wt 225.6 lb

## 2023-03-05 DIAGNOSIS — R112 Nausea with vomiting, unspecified: Secondary | ICD-10-CM | POA: Diagnosis not present

## 2023-03-05 DIAGNOSIS — R1084 Generalized abdominal pain: Secondary | ICD-10-CM | POA: Diagnosis not present

## 2023-03-05 DIAGNOSIS — K219 Gastro-esophageal reflux disease without esophagitis: Secondary | ICD-10-CM | POA: Diagnosis not present

## 2023-03-05 DIAGNOSIS — K5901 Slow transit constipation: Secondary | ICD-10-CM

## 2023-03-05 MED ORDER — LINACLOTIDE 72 MCG PO CAPS: 72.0000 ug | ORAL_CAPSULE | Freq: Every day | ORAL | 1 refills | Status: DC

## 2023-03-05 MED ORDER — PANTOPRAZOLE SODIUM 40 MG PO TBEC: 40.0000 mg | DELAYED_RELEASE_TABLET | Freq: Every day | ORAL | 2 refills | Status: DC

## 2023-03-05 NOTE — Telephone Encounter (Signed)
The patient contacted our office confirming the rescheduled for today at [facility] at 4pm. Will not work for her. I advise the patient Centralized scheduling contact number for rescheduling [phone]. The patient states she will call to get this ultrasound rescheduled.

## 2023-03-05 NOTE — Progress Notes (Signed)
Celso Amy, PA-C [address]  [address]  [place], [place] [address]  Main: [phone]  Fax: [phone]   Gastroenterology Consultation  Referring Provider:     Luciana Axe, NP Primary Care Physician:  Luciana Axe, NP Primary Gastroenterologist:  Celso Amy, PA-C / Dr. Wyline Mood  Reason for Consultation:     Constipation        HPI:   Barbara [name] is a 32 y.o. y/o female referred for consultation & management  by Luciana Axe, NP.    She went to [facility] ED 01/16/2023 to evaluate constipation, abdominal pain, and intermittent nausea and vomiting.  CBC, CMP, and lipase labs were normal.  Abdominal x-ray showed moderate stool throughout the colon.  She was started on Colace stool softener 100 mg daily, MiraLAX daily, Senokot, magnesium citrate, and fleets enema if needed.  Was told to follow-up with GI.  Patient struggles with obesity and weight gain.  She has been dieting with a high protein diet since November 2024.  Unable to lose weight.  She noticed worsening constipation for the past 2 months.  She saw alternative medicine specialist and was put on a bowel stimulant and treatment for bacteria/parasite.  Unknown medication.  She reports increasing nausea for the past 1 or 2 months.  She has had persistent menstrual bleeding for the past month.  Has IUD in place.  She followed up with her OB/GYN and is scheduled for pelvic ultrasound in the near future.  She has had pain in her mid upper and lower abdomen.  Also having intermittent lower abdominal cramping for the past month.  Bowel habits are very irregular.  She has good days and bad days.  2 normal stools in the past week.  Otherwise she has had some loose bowel movements.  She is currently taking MiraLAX twice daily and Benefiber 3 times daily.  This is not working well.  She is very frustrated.  She denies rectal bleeding or weight loss.  Has history of acid reflux for many years.  No current  treatment.  She is having a lot of heartburn and reflux.  Vomiting episodes once per week with nausea.  No previous EGD, colonoscopy, or GI evaluation.  Past Medical History:  Diagnosis Date   Anxiety    Depression    History of Papanicolaou smear of cervix 11/06/14; 02/06/16   NEG-CT POS; NEG - CT/GC/TR NEG;   STD (sexually transmitted disease)    chlamydia    Past Surgical History:  Procedure Laterality Date   elective abortion  11/2013   [facility] [place]    Prior to Admission medications   Medication Sig Start Date End Date Taking? Authorizing Provider  levonorgestrel (MIRENA) 20 MCG/24HR IUD 1 each by Intrauterine route once.   Yes [provider]  Probiotic Product (CULTURELLE PROBIOTICS PO) Take by mouth.   Yes [provider]  Wheat Dextrin (BENEFIBER DRINK MIX PO) Take by mouth.   Yes [provider]    Family History  Problem Relation Age of Onset   Depression Mother    Endometrial cancer Mother    Alcohol abuse Father    Alcoholism Father    Depression Father    Post-traumatic stress disorder Father    Depression Sister    Depression Brother    Breast cancer Maternal Grandmother 62   Diabetes Maternal Grandfather    Renal Disease Maternal Grandfather      Social History  Tobacco Use   Smoking status: Former    Types: Cigarettes   Smokeless tobacco: Never  Vaping Use   Vaping Use: Former  Substance Use Topics   Alcohol use: Yes    Alcohol/week: 3.0 standard drinks of alcohol    Types: 3 Glasses of wine per week    Comment: occass   Drug use: Not Currently    Allergies as of 03/05/2023 - Review Complete 03/05/2023  Allergen Reaction Noted   Pineapple Itching 06/19/2020   Doxycycline Itching 03/14/2020   Prednisone Itching 01/16/2023    Review of Systems:    All systems reviewed and negative except where noted in HPI.   Physical Exam:  BP 121/83   Pulse 100   Temp 97.8 F (36.6 C)   Ht 5\' 6"  (1.676 m)    Wt 225 lb 9.6 oz (102.3 kg)   BMI 36.41 kg/m  No LMP recorded. (Menstrual status: IUD). Psych:  Alert and cooperative. Depressed mood and affect. General:   Alert,  Well-developed, well-nourished, pleasant and cooperative in NAD Head:  Normocephalic and atraumatic. Eyes:  Sclera clear, no icterus.   Conjunctiva pink. Neck:  Supple; no masses or thyromegaly. Lungs:  Respirations even and unlabored.  Clear throughout to auscultation.   No wheezes, crackles, or rhonchi. No acute distress. Heart:  Regular rate and rhythm; no murmurs, clicks, rubs, or gallops. Abdomen:  Normal bowel sounds.  No bruits.  Soft, and non-distended without masses, hepatosplenomegaly or hernias noted.  Moderate LLQ and Mild RLQ Tenderness.  No guarding or rebound tenderness.    Neurologic:  Alert and oriented x3;  grossly normal neurologically. Psych:  Alert and cooperative. Depressed mood and affect.  Imaging Studies: No results found.  Assessment and Plan:   [name] is a 32 y.o. y/o female has been referred for Constipation and Abdominal Pain.  She also has acid reflux with episodes of nausea and vomiting.  I am ordering lab work.  Giving treatment for constipation and GERD with follow-up.  Encouraged her to continue follow-up with her GYN for dysfunctional uterine bleeding and pelvic pain.  She is scheduled for pelvic ultrasound in the near future.  If abdominal pain worsens or persist, then I will schedule abdominal pelvic CT.  Constipation Lab: TSH Gave samples of Linzess 72 mcg QD for 1 week, then 145 mcg QD for 1 week.  She will let me know which dose works best, and then she can call me back for a prescription.  Discussed constipation treatment at length. Recommend High Fiber diet with fruits, vegetables, and whole grains. Drink 64 ounces of Fluids Daily.  2.   Abdominal Pain, Generalized: LLQ and RLQ tenderness on exam today  Labs:  CBC, CMP, Celiac  If pain worsens, then I will order abd /  pelvic CT.  She is scheduled for pelvic ultrasound through her GYN.  She has an IUD.   She is having dysfunctional uterine bleeding.  I encouraged her to f/u with OB/GYN.   3.   Nausea / Vomiting  Lab: HCG  4.   GERD  Rx: Pantoprazole 40mg  1 tablet once daily. Recommend Lifestyle Modifications to prevent Acid Reflux.  Rec. Avoid coffee, sodas, peppermint, citrus fruits, and spicey foods.  Avoid eating 2-3 hours before bedtime.   Follow up in 4 weeks with TG.  Celso Amy, PA-C

## 2023-03-05 NOTE — Patient Instructions (Signed)

## 2023-03-05 NOTE — Telephone Encounter (Signed)
I contacted this patient via phone. I left message that our ultrasound tech is out of office, we will need to rescheduled. I also let her know I will attempt to rescheduled and reach out to confirm new date and time for imaging appointment.  

## 2023-03-05 NOTE — Telephone Encounter (Signed)
I contacted the patient via phone. I advise the patient we were able to rescheduled to please contact out office for new information.

## 2023-03-08 ENCOUNTER — Telehealth: Payer: Self-pay

## 2023-03-08 ENCOUNTER — Encounter: Payer: Self-pay | Admitting: Obstetrics and Gynecology

## 2023-03-08 LAB — COMPREHENSIVE METABOLIC PANEL
ALT: 12 IU/L (ref 0–32)
AST: 12 IU/L (ref 0–40)
Albumin/Globulin Ratio: 2 (ref 1.2–2.2)
Albumin: 4.3 g/dL (ref 4.0–5.0)
Alkaline Phosphatase: 67 IU/L (ref 44–121)
BUN/Creatinine Ratio: 15 (ref 9–23)
BUN: 11 mg/dL (ref 6–20)
Bilirubin Total: 0.2 mg/dL (ref 0.0–1.2)
CO2: 22 mmol/L (ref 20–29)
Calcium: 9.3 mg/dL (ref 8.7–10.2)
Chloride: 105 mmol/L (ref 96–106)
Creatinine, Ser: 0.73 mg/dL (ref 0.57–1.00)
Globulin, Total: 2.1 g/dL (ref 1.5–4.5)
Glucose: 88 mg/dL (ref 70–99)
Potassium: 4.6 mmol/L (ref 3.5–5.2)
Sodium: 142 mmol/L (ref 134–144)
Total Protein: 6.4 g/dL (ref 6.0–8.5)
eGFR: 113 mL/min/{1.73_m2} (ref >59)

## 2023-03-08 LAB — CBC WITH DIFFERENTIAL
Basophils Absolute: 0 10*3/uL (ref 0.0–0.2)
Basos: 1 %
EOS (ABSOLUTE): 0.1 10*3/uL (ref 0.0–0.4)
Eos: 2 %
Hematocrit: 42.7 % (ref 34.0–46.6)
Hemoglobin: 14.1 g/dL (ref 11.1–15.9)
Immature Grans (Abs): 0 10*3/uL (ref 0.0–0.1)
Immature Granulocytes: 0 %
Lymphocytes Absolute: 2.1 10*3/uL (ref 0.7–3.1)
Lymphs: 35 %
MCH: 30.3 pg (ref 26.6–33.0)
MCHC: 33 g/dL (ref 31.5–35.7)
MCV: 92 fL (ref 79–97)
Monocytes Absolute: 0.4 10*3/uL (ref 0.1–0.9)
Monocytes: 6 %
Neutrophils Absolute: 3.4 10*3/uL (ref 1.4–7.0)
Neutrophils: 56 %
RBC: 4.66 x10E6/uL (ref 3.77–5.28)
RDW: 12 % (ref 11.7–15.4)
WBC: 6 10*3/uL (ref 3.4–10.8)

## 2023-03-08 LAB — CELIAC AB TTG DGP TIGA
Antigliadin Abs, IgA: 5 units (ref 0–19)
Gliadin IgG: 2 units (ref 0–19)
IgA/Immunoglobulin A, Serum: 112 mg/dL (ref 87–352)
Tissue Transglut Ab: <2 U/mL (ref 0–5)
Transglutaminase IgA: <2 U/mL (ref 0–3)

## 2023-03-08 LAB — HCG, SERUM, QUALITATIVE: hCG,Beta Subunit,Qual,Serum: NEGATIVE m[IU]/mL (ref <6)

## 2023-03-08 LAB — TSH: TSH: 0.792 u[IU]/mL (ref 0.450–4.500)

## 2023-03-08 NOTE — Progress Notes (Signed)
Notify patient CBC, CMP, and TSH labs are normal.  White count, hemoglobin, kidney test, liver test, and thyroid test are all normal.  Pregnancy test is negative.  Celiac labs are negative.  I recommend continue Linzess for constipation and pantoprazole for GERD that were given at office visit.

## 2023-03-08 NOTE — Telephone Encounter (Signed)
Left message for patient to return call to office.   Notify patient CBC, CMP, and TSH labs are normal.  White count, hemoglobin, kidney test, liver test, and thyroid test are all normal.  Pregnancy test is negative.  Celiac labs are negative.  I recommend continue Linzess for constipation and pantoprazole for GERD that were given at office visit.

## 2023-03-24 ENCOUNTER — Encounter: Payer: 59 | Admitting: Nurse Practitioner

## 2023-03-24 DIAGNOSIS — Z0289 Encounter for other administrative examinations: Secondary | ICD-10-CM

## 2023-03-25 ENCOUNTER — Telehealth: Payer: Self-pay | Admitting: Obstetrics and Gynecology

## 2023-03-25 NOTE — Telephone Encounter (Signed)
The patient is calling today, wanting to scheduled for IUD removal with possible replacement. The patient was advised we would need to complete her annual exam since she hasn't been seen for that since 06/19/2000. The patient is requesting to been seen for both annual and removal and possible replacement at scheduled time. I advised the patient Dr. Valentino Saxon doesn't do both in one appointment with would have to scheduled an different day for that appointment. The patient states "she can't just take time off for two different days". She said "She would come in at an 9:15 am for the annual time and then scheduled same day for an PM appointment time for the removal with possible replacement for her IUD". I explain to the patient her insurance wouldn't allow that. She said "that is what she can do, but she can't take off so two days from work"! Could you please advise? I have her scheduled for 04/14/23 with Dr. Valentino Saxon for annual exam. The patient would like a call back. She is aware Dr. Valentino Saxon is out of the office today. I did secure Helmut Muster, she said "she put in for an order an ultrasound order and the patient never had the imaging done to find out the cause for the vaginal bleeding with IUD".

## 2023-03-25 NOTE — Telephone Encounter (Signed)
I don't have any problems with IUD removal and replacement in the same day. However Mirena IUDs are now good for 8 years, and since hers was placed in 2019, she may not be due for replacement at this time.  If there is availability on 7/10 for ultrasound, she could have this performed that day as well.

## 2023-03-26 NOTE — Telephone Encounter (Signed)
I contacted the patient via phone. She is would like to have her ultrasound prior to annual so she is able to confirm finding at her annual wellness with Dr. Valentino Saxon. Dr. Valentino Saxon (Verbally) said she would speak with the patient at her annual wellness exam of ultrasound findings to determine the cause of the irregular bleeding with the IUD since it is good for 8 years, but to have the patient beware it is best to have ultrasound prior to rule out anything unseen that could contribute to her irregular bleeding and then we could go from there if other alternatives are needed. The patient is happy to have ultrasound prior to annual exam. She is happy that she feels like "she is being heard". She expresses "Thankfulness".The patient is scheduled for [facility] on Friday, 6/28 at 4:00 pm and annual with Dr. Valentino Saxon on 7/10.

## 2023-03-26 NOTE — Telephone Encounter (Signed)
Ok thank you 

## 2023-03-30 ENCOUNTER — Telehealth (INDEPENDENT_AMBULATORY_CARE_PROVIDER_SITE_OTHER): Payer: 59 | Admitting: Family Medicine

## 2023-03-30 ENCOUNTER — Encounter (INDEPENDENT_AMBULATORY_CARE_PROVIDER_SITE_OTHER): Payer: Self-pay | Admitting: Family Medicine

## 2023-03-30 DIAGNOSIS — K219 Gastro-esophageal reflux disease without esophagitis: Secondary | ICD-10-CM

## 2023-03-30 DIAGNOSIS — Z6836 Body mass index (BMI) 36.0-36.9, adult: Secondary | ICD-10-CM

## 2023-03-30 DIAGNOSIS — E669 Obesity, unspecified: Secondary | ICD-10-CM

## 2023-03-30 DIAGNOSIS — K5900 Constipation, unspecified: Secondary | ICD-10-CM

## 2023-03-30 NOTE — Progress Notes (Signed)
Office: [phone]  /  Fax: [phone]   TeleHealth Visit:  This visit was completed with telemedicine (audio/video) technology. Precious has verbally consented to this TeleHealth visit. The patient is located at home, the provider is located at home. The participants in this visit include the listed provider and patient. The visit was conducted today via MyChart video.  Initial Visit  Barbara [name] was seen via virtual visit today to evaluate for treatment of obesity. She is interested in losing weight to improve overall health and reduce the risk of weight related complications. She presents today to review program treatment options, initial physical assessment, and evaluation.     Height: 5\' 6"  Weight: 225 lbs BMI: 36  She was referred by: PCP  When asked what else they would like to accomplish? She states: Adopt healthier eating patterns, Improve quality of life, and Improve self-confidence  Weight history: She was overweight as a child.   Lost > 100 lbs in 2014 and maintained for several years. Top weight was 298 lbs.  When asked how has your weight affected you? She states: Has affected self-esteem  Some associated conditions: GERD and Other: depression  and anxiety. Has struggled with abdominal issues-pain and constipation.  Contributing factors: Family history and Pregnancy  Weight promoting medications identified: None  Current nutrition plan: Other: focuses on protein.  Current level of physical activity: None and Other: strength training once weekly and walks daily  Current or previous pharmacotherapy: Is interested in pharmacotherapy  Response to medication: Never tried medications  Past medical history includes:   Past Medical History:  Diagnosis Date   Anxiety    Depression    History of Papanicolaou smear of cervix 11/06/14; 02/06/16   NEG-CT POS; NEG - CT/GC/TR NEG;   STD (sexually transmitted disease)    chlamydia     Objective:     General:   Alert, oriented and cooperative. Patient is in no acute distress.  Respiratory: Normal respiratory effort, no problems with respiration noted  Mental Status: Normal mood and affect. Normal behavior. Normal judgment and thought content.    Assessment and Plan:   1. Gastroesophageal reflux disease without esophagitis Symptoms are well-controlled. Medication: Pantoprazole 40 mg - takes as needed  Plan: Continue PPI. Look for improvement of symptoms with weight loss.  2. Constipation Barbara [name] notes constipation.   Takes Benefiber consistently She has been taking linzess intermittently. Constipation is moderately controlled.   Plan: Continue Benefiber and Linzess as needed.  3. Obesity Treatment / Action Plan:  Will complete provided nutritional and psychosocial assessment questionnaire before the next appointment. Will be scheduled for indirect calorimetry to determine resting energy expenditure in a fasting state.  This will allow [place] to create a reduced calorie, high-protein meal plan to promote loss of fat mass while preserving muscle mass. Was counseled on nutritional approaches to weight loss and benefits of reducing processed foods and consuming plant-based foods and high quality protein as part of nutritional weight management. Was counseled on pharmacotherapy and role as an adjunct in weight management.   Obesity Education Performed Today:  We discussed obesity as a disease and the importance of a more detailed evaluation of all the factors contributing to the disease.  We discussed the importance of long term lifestyle changes which include nutrition, exercise and behavioral modifications as well as the importance of customizing this to her specific health and social needs.  We discussed the benefits of reaching a healthier weight to alleviate the symptoms of existing conditions  and reduce the risks of the biomechanical, metabolic and psychological effects of obesity.  Barbara  L [name] appears to be in the action stage of change and states they are ready to start intensive lifestyle modifications and behavioral modifications.  ______________________________________________________________________________  She will be contacted by [facility] to set up initial appointment and the first follow up appointment with a physician.   The following office policies were discussed. She voiced understanding: - Patient will be considered late at 6 minutes past appointment time.   - For the first office visit, patient needs to arrive 1 hour early, fasting except for water. Patient should arrive 15 minutes early for all other visits. -  Patient will bring completed new patient paperwork to first office visit.  If not, appointment will be rescheduled.  30 minutes was spent today on this visit including the above counseling, pre-visit chart review, and post-visit documentation.  Reviewed by clinician on day of visit: allergies, medications, problem list, medical history, surgical history, family history, social history, and previous encounter notes pertinent to obesity diagnosis.    Jesse Sans, FNP

## 2023-04-02 ENCOUNTER — Ambulatory Visit: Payer: 59 | Admitting: Physician Assistant

## 2023-04-06 ENCOUNTER — Ambulatory Visit
Admission: RE | Admit: 2023-04-06 | Discharge: 2023-04-06 | Disposition: A | Discharge status: Home / Self Care | Payer: 59 | Source: Ambulatory Visit | Attending: Obstetrics and Gynecology | Admitting: Obstetrics and Gynecology

## 2023-04-06 DIAGNOSIS — Z30431 Encounter for routine checking of intrauterine contraceptive device: Secondary | ICD-10-CM | POA: Insufficient documentation

## 2023-04-06 DIAGNOSIS — R1032 Left lower quadrant pain: Secondary | ICD-10-CM | POA: Diagnosis present

## 2023-04-08 NOTE — Progress Notes (Signed)
This pt is coming to you for annual, possible IUD  replacement.

## 2023-04-14 ENCOUNTER — Encounter: Payer: Self-pay | Admitting: Obstetrics and Gynecology

## 2023-04-14 ENCOUNTER — Other Ambulatory Visit (HOSPITAL_COMMUNITY)
Admission: RE | Admit: 2023-04-14 | Discharge: 2023-04-14 | Disposition: A | Discharge status: Home / Self Care | Payer: 59 | Source: Ambulatory Visit | Attending: Obstetrics and Gynecology | Admitting: Obstetrics and Gynecology

## 2023-04-14 ENCOUNTER — Other Ambulatory Visit (HOSPITAL_COMMUNITY): Admission: RE | Admit: 2023-04-14 | Payer: 59 | Source: Ambulatory Visit

## 2023-04-14 ENCOUNTER — Ambulatory Visit (INDEPENDENT_AMBULATORY_CARE_PROVIDER_SITE_OTHER): Payer: 59 | Admitting: Obstetrics and Gynecology

## 2023-04-14 VITALS — BP 127/82 | HR 96 | Resp 16 | Ht 66.0 in | Wt 217.6 lb

## 2023-04-14 DIAGNOSIS — N921 Excessive and frequent menstruation with irregular cycle: Secondary | ICD-10-CM

## 2023-04-14 DIAGNOSIS — Z01419 Encounter for gynecological examination (general) (routine) without abnormal findings: Secondary | ICD-10-CM

## 2023-04-14 DIAGNOSIS — L292 Pruritus vulvae: Secondary | ICD-10-CM

## 2023-04-14 DIAGNOSIS — Z1159 Encounter for screening for other viral diseases: Secondary | ICD-10-CM

## 2023-04-14 DIAGNOSIS — Z124 Encounter for screening for malignant neoplasm of cervix: Secondary | ICD-10-CM | POA: Diagnosis present

## 2023-04-14 DIAGNOSIS — F32A Depression, unspecified: Secondary | ICD-10-CM

## 2023-04-14 MED ORDER — ARIPIPRAZOLE 5 MG PO TABS
5.0000 mg | ORAL_TABLET | Freq: Every day | ORAL | 0 refills | Status: DC
Start: 2023-04-14 — End: 2023-04-20

## 2023-04-14 NOTE — Patient Instructions (Addendum)
Preventive Care 21-31 Years Old, Female Preventive care refers to lifestyle choices and visits with your health care provider that can promote health and wellness. Preventive care visits are also called wellness exams. What can I expect for my preventive care visit? Counseling During your preventive care visit, your health care provider may ask about your: Medical history, including: Past medical problems. Family medical history. Pregnancy history. Current health, including: Menstrual cycle. Method of birth control. Emotional well-being. Home life and relationship well-being. Sexual activity and sexual health. Lifestyle, including: Alcohol, nicotine or tobacco, and drug use. Access to firearms. Diet, exercise, and sleep habits. Work and work environment. Sunscreen use. Safety issues such as seatbelt and bike helmet use. Physical exam Your health care provider may check your: Height and weight. These may be used to calculate your BMI (body mass index). BMI is a measurement that tells if you are at a healthy weight. Waist circumference. This measures the distance around your waistline. This measurement also tells if you are at a healthy weight and may help predict your risk of certain diseases, such as type 2 diabetes and high blood pressure. Heart rate and blood pressure. Body temperature. Skin for abnormal spots. What immunizations do I need?  Vaccines are usually given at various ages, according to a schedule. Your health care provider will recommend vaccines for you based on your age, medical history, and lifestyle or other factors, such as travel or where you work. What tests do I need? Screening Your health care provider may recommend screening tests for certain conditions. This may include: Pelvic exam and Pap test. Lipid and cholesterol levels. Diabetes screening. This is done by checking your blood sugar (glucose) after you have not eaten for a while (fasting). Hepatitis  B test. Hepatitis C test. HIV (human immunodeficiency virus) test. STI (sexually transmitted infection) testing, if you are at risk. BRCA-related cancer screening. This may be done if you have a family history of breast, ovarian, tubal, or peritoneal cancers. Talk with your health care provider about your test results, treatment options, and if necessary, the need for more tests. Follow these instructions at home: Eating and drinking  Eat a healthy diet that includes fresh fruits and vegetables, whole grains, lean protein, and low-fat dairy products. Take vitamin and mineral supplements as recommended by your health care provider. Do not drink alcohol if: Your health care provider tells you not to drink. You are pregnant, may be pregnant, or are planning to become pregnant. If you drink alcohol: Limit how much you have to 0-1 drink a day. Know how much alcohol is in your drink. In the [place] one drink equals one 12 oz bottle of beer (355 mL), one 5 oz glass of wine (148 mL), or one 1 oz glass of hard liquor (44 mL). Lifestyle Brush your teeth every morning and night with fluoride toothpaste. Floss one time each day. Exercise for at least 30 minutes 5 or more days each week. Do not use any products that contain nicotine or tobacco. These products include cigarettes, chewing tobacco, and vaping devices, such as e-cigarettes. If you need help quitting, ask your health care provider. Do not use drugs. If you are sexually active, practice safe sex. Use a condom or other form of protection to prevent STIs. If you do not wish to become pregnant, use a form of birth control. If you plan to become pregnant, see your health care provider for a prepregnancy visit. Find healthy ways to manage stress, such as: Meditation,   yoga, or listening to music. Journaling. Talking to a trusted person. Spending time with friends and family. Minimize exposure to UV radiation to reduce your risk of skin  cancer. Safety Always wear your seat belt while driving or riding in a vehicle. Do not drive: If you have been drinking alcohol. Do not ride with someone who has been drinking. If you have been using any mind-altering substances or drugs. While texting. When you are tired or distracted. Wear a helmet and other protective equipment during sports activities. If you have firearms in your house, make sure you follow all gun safety procedures. Seek help if you have been physically or sexually abused. What's next? Go to your health care provider once a year for an annual wellness visit. Ask your health care provider how often you should have your eyes and teeth checked. Stay up to date on all vaccines. This information is not intended to replace advice given to you by your health care provider. Make sure you discuss any questions you have with your health care provider. Document Revised: 03/19/2021 Document Reviewed: 03/19/2021 Elsevier Patient Education  2024 Elsevier Inc. Breast Self-Awareness Breast self-awareness is knowing how your breasts look and feel. You need to: Check your breasts on a regular basis. Tell your doctor about any changes. Become familiar with the look and feel of your breasts. This can help you catch a breast problem while it is still small and can be treated. You should do breast self-exams even if you have breast implants. What you need: A mirror. A well-lit room. A pillow or other soft object. How to do a breast self-exam Follow these steps to do a breast self-exam: Look for changes  Take off all the clothes above your waist. Stand in front of a mirror in a room with good lighting. Put your hands down at your sides. Compare your breasts in the mirror. Look for any difference between them, such as: A difference in shape. A difference in size. Wrinkles, dips, and bumps in one breast and not the other. Look at each breast for changes in the skin, such  as: Redness. Scaly areas. Skin that has gotten thicker. Dimpling. Open sores (ulcers). Look for changes in your nipples, such as: Fluid coming out of a nipple. Fluid around a nipple. Bleeding. Dimpling. Redness. A nipple that looks pushed in (retracted), or that has changed position. Feel for changes Lie on your back. Feel each breast. To do this: Pick a breast to feel. Place a pillow under the shoulder closest to that breast. Put the arm closest to that breast behind your head. Feel the nipple area of that breast using the hand of your other arm. Feel the area with the pads of your three middle fingers by making small circles with your fingers. Use light, medium, and firm pressure. Continue the overlapping circles, moving downward over the breast. Keep making circles with your fingers. Stop when you feel your ribs. Start making circles with your fingers again, this time going upward until you reach your collarbone. Then, make circles outward across your breast and into your armpit area. Squeeze your nipple. Check for discharge and lumps. Repeat these steps to check your other breast. Sit or stand in the tub or shower. With soapy water on your skin, feel each breast the same way you did when you were lying down. Write down what you find Writing down what you find can help you remember what to tell your doctor. Write down: What is   normal for each breast. Any changes you find in each breast. These include: The kind of changes you find. A tender or painful breast. Any lump you find. Write down its size and where it is. When you last had your monthly period (menstrual cycle). General tips If you are breastfeeding, the best time to check your breasts is after you feed your baby or after you use a breast pump. If you get monthly bleeding, the best time to check your breasts is 5-7 days after your monthly cycle ends. With time, you will become comfortable with the self-exam. You will  also start to know if there are changes in your breasts. Contact a doctor if: You see a change in the shape or size of your breasts or nipples. You see a change in the skin of your breast or nipples, such as red or scaly skin. You have fluid coming from your nipples that is not normal. You find a new lump or thick area. You have breast pain. You have any concerns about your breast health. Summary Breast self-awareness includes looking for changes in your breasts and feeling for changes within your breasts. You should do breast self-awareness in front of a mirror in a well-lit room. If you get monthly periods (menstrual cycles), the best time to check your breasts is 5-7 days after your period ends. Tell your doctor about any changes you see in your breasts. Changes include changes in size, changes on the skin, painful or tender breasts, or fluid from your nipples that is not normal. This information is not intended to replace advice given to you by your health care provider. Make sure you discuss any questions you have with your health care provider. Document Revised: 02/26/2022 Document Reviewed: 07/24/2021 Elsevier Patient Education  2024 Elsevier Inc.  

## 2023-04-14 NOTE — Progress Notes (Signed)
GYNECOLOGY ANNUAL PHYSICAL EXAM PROGRESS NOTE  Subjective:    Barbara [name] is a 32 y.o. G39P1011 female who presents for an annual exam.  The patient is sexually active. The patient participates in regular exercise: yes. Has the patient ever been transfused or tattooed?: no. The patient reports that there is not domestic violence in her life.   The patient has the following complaints today:  1.  Patient reports that she has been having issues with breakthrough bleeding on her IUD.  This has been going on for the past several months.  Notes the bleeding is not consistent like a cycle and does have fairly significant cramping at times.  She did have a recent ultrasound that was normal with IUD located in the proper place.  Patient has had her IUD in since 2019 with no issues at until this point.  Was considering having it removed but would like to discuss her options today.  Also desires to have workup for PCOS.  2.  She also complains of vulvar irritation/itching.  Notes that this has been a problem off and on for the past 10 years.  Has been worked up in the past for vaginal infections which have been negative.  Has tried changing soaps, detergents, underwear without any noticeable difference in her symptoms.  Sometimes attempts to treat with over-the-counter Monistat which typically helps for short period of time but the itching and irritation returned.  3.  Lastly patient notes that she stopped her antidepressant 6 months ago.  Had been feeling fairly well up until approximately 1 month ago when she began noticing that she was starting to have a return of her symptoms.  Notes that the last 2 weeks have been very stressful and is working to try to reestablish with her psychiatrist.  Needs to resume her medications but notes that she no longer has an active prescription.  Menstrual History: Menarche age: 72 No LMP recorded. (Menstrual status: IUD). Inserted in 2019 Period Duration (Days):  3-10 Period Pattern: (!) Irregular Menstrual Flow: Light Menstrual Control: Panty liner, Tampon Menstrual Control Change Freq (Hours): 8 Dysmenorrhea: (!) Severe Dysmenorrhea Symptoms: Cramping, Nausea (Vomiting)     Gynecologic History:  Contraception: IUD History of STI's: Denies Last Pap: 06/19/2020. Results were: normal. Denies h/o abnormal pap smears. Last mammogram: Not age appropriate   OB History  Gravida Para Term Preterm AB Living  2 1 1  0 1 1  SAB IAB Ectopic Multiple Live Births  0 1 0 0 1    # Outcome Date GA Lbr Len/2nd Weight Sex Delivery Anes PTL Lv  2 Term 08/01/18 [redacted]w[redacted]d / 02:52 8 lb 6 oz (3.8 kg) F Vag-Spont EPI  LIV     Name: RAVYN, NIKKEL     Apgar1: 7  Apgar5: 9  1 IAB             Past Medical History:  Diagnosis Date   Anxiety    Depression    History of Papanicolaou smear of cervix 11/06/14; 02/06/16   NEG-CT POS; NEG - CT/GC/TR NEG;   STD (sexually transmitted disease)    chlamydia    Past Surgical History:  Procedure Laterality Date   elective abortion  11/2013   [facility] [place]    Family History  Problem Relation Age of Onset   Depression Mother    Endometrial cancer Mother    Alcohol abuse Father    Alcoholism Father    Depression Father  Post-traumatic stress disorder Father    Depression Sister    Depression Brother    Breast cancer Maternal Grandmother 70   Diabetes Maternal Grandfather    Renal Disease Maternal Grandfather     Social History   Socioeconomic History   Marital status: Married    Spouse name: Wellsite geologist   Number of children: 1   Years of education: Not on file   Highest education level: Bachelor's degree (e.g., BA, AB, BS)  Occupational History   Not on file  Tobacco Use   Smoking status: Former    Types: Cigarettes   Smokeless tobacco: Never  Vaping Use   Vaping Use: Former  Substance and Sexual Activity   Alcohol use: Yes    Alcohol/week: 3.0 standard drinks of alcohol    Types: 3  Glasses of wine per week    Comment: occass   Drug use: Not Currently   Sexual activity: Yes    Birth control/protection: I.U.D.    Comment: Mirena  Other Topics Concern   Not on file  Social History Narrative   Not on file   Social Determinants of Health   Financial Resource Strain: Low Risk  (07/15/2018)   Overall Financial Resource Strain (CARDIA)    Difficulty of Paying Living Expenses: Not hard at all  Food Insecurity: No Food Insecurity (07/15/2018)   Hunger Vital Sign    Worried About Running Out of Food in the Last Year: Never true    Ran Out of Food in the Last Year: Never true  Transportation Needs: No Transportation Needs (07/15/2018)   PRAPARE - Administrator, Civil Service (Medical): No    Lack of Transportation (Non-Medical): No  Physical Activity: Inactive (07/15/2018)   Exercise Vital Sign    Days of Exercise per Week: 0 days    Minutes of Exercise per Session: 0 min  Stress: Not on file  Social Connections: Not on file  Intimate Partner Violence: Not on file    Current Outpatient Medications on File Prior to Visit  Medication Sig Dispense Refill   levonorgestrel (MIRENA) 20 MCG/24HR IUD 1 each by Intrauterine route once.     linaclotide (LINZESS) 72 MCG capsule Take 1 capsule (72 mcg total) by mouth daily before breakfast. 30 capsule 1   pantoprazole (PROTONIX) 40 MG tablet Take 1 tablet (40 mg total) by mouth daily. 30 tablet 2   Probiotic Product (CULTURELLE PROBIOTICS PO) Take by mouth.     Wheat Dextrin (BENEFIBER DRINK MIX PO) Take by mouth.     No current facility-administered medications on file prior to visit.    Allergies  Allergen Reactions   Pineapple Itching    Throat itching    Doxycycline Itching    All over itching, no hives   Prednisone Itching     Review of Systems Constitutional: negative for chills, fatigue, fevers and sweats Eyes: negative for irritation, redness and visual disturbance Ears, nose, mouth, throat,  and face: negative for hearing loss, nasal congestion, snoring and tinnitus Respiratory: negative for asthma, cough, sputum Cardiovascular: negative for chest pain, dyspnea, exertional chest pressure/discomfort, irregular heart beat, palpitations and syncope Gastrointestinal: negative for abdominal pain, change in bowel habits, nausea and vomiting Genitourinary: negative for abnormal menstrual periods, genital lesions, sexual problems and vaginal discharge, dysuria and urinary incontinence Integument/breast: negative for breast lump, breast tenderness and nipple discharge Hematologic/lymphatic: negative for bleeding and easy bruising Musculoskeletal:negative for back pain and muscle weakness Neurological: negative for dizziness, headaches, vertigo and weakness  Endocrine: negative for diabetic symptoms including polydipsia, polyuria and skin dryness Allergic/Immunologic: negative for hay fever and urticaria      Objective:  Blood pressure 127/82, pulse 96, resp. rate 16, height 5\' 6"  (1.676 m), weight 217 lb 9.6 oz (98.7 kg), last menstrual period 03/16/2023.  Body mass index is 35.12 kg/m.    General Appearance:    Alert, cooperative, no distress, appears stated age  Head:    Normocephalic, without obvious abnormality, atraumatic  Eyes:    PERRL, conjunctiva/corneas clear, EOM's intact, both eyes  Ears:    Normal external ear canals, both ears  Nose:   Nares normal, septum midline, mucosa normal, no drainage or sinus tenderness  Throat:   Lips, mucosa, and tongue normal; teeth and gums normal  Neck:   Supple, symmetrical, trachea midline, no adenopathy; thyroid: no enlargement/tenderness/nodules; no carotid bruit or JVD  Back:     Symmetric, no curvature, ROM normal, no CVA tenderness  Lungs:     Clear to auscultation bilaterally, respirations unlabored  Chest Wall:    No tenderness or deformity   Heart:    Regular rate and rhythm, S1 and S2 normal, no murmur, rub or gallop  Breast Exam:     No tenderness, masses, or nipple abnormality  Abdomen:     Soft, non-tender, bowel sounds active all four quadrants, no masses, no organomegaly.    Genitalia:    Pelvic:external genitalia normal, vagina without lesions, scant blood in vaginal vault. Rectovaginal septum  normal. Cervix normal in appearance, no cervical motion tenderness, no adnexal masses or tenderness.  Uterus normal size, shape, mobile, regular contours, nontender.  Rectal:    Normal external sphincter.  No hemorrhoids appreciated. Internal exam not done.   Extremities:   Extremities normal, atraumatic, no cyanosis or edema  Pulses:   2+ and symmetric all extremities  Skin:   Skin color, texture, turgor normal, no rashes or lesions  Lymph nodes:   Cervical, supraclavicular, and axillary nodes normal  Neurologic:   CNII-XII intact, normal strength, sensation and reflexes throughout   .  Labs:  Lab Results  Component Value Date   WBC 6.0 03/05/2023   HGB 14.1 03/05/2023   HCT 42.7 03/05/2023   MCV 92 03/05/2023   PLT 195 08/02/2018    Lab Results  Component Value Date   CREATININE 0.73 03/05/2023   BUN 11 03/05/2023   NA 142 03/05/2023   K 4.6 03/05/2023   CL 105 03/05/2023   CO2 22 03/05/2023    Lab Results  Component Value Date   ALT 12 03/05/2023   AST 12 03/05/2023   ALKPHOS 67 03/05/2023   BILITOT 0.2 03/05/2023    Lab Results  Component Value Date   TSH 0.792 03/05/2023     Assessment:   1. Encounter for well woman exam with routine gynecological exam   2. Breakthrough bleeding associated with intrauterine device (IUD)   3. Encounter for hepatitis C screening test for low risk patient   4. Cervical cancer screening   5. Anxiety and depression   6. Vulvar itching      Plan:    1. Encounter for well woman exam with routine gynecological exam  - Blood tests: PCOS order panel per patient request. - Breast self exam technique reviewed and patient encouraged to perform self-exam  monthly. - Contraception: IUD. - Discussed healthy lifestyle modifications.  2. Breakthrough bleeding associated with intrauterine device (IUD) -Discussed options for management of patient's breakthrough bleeding including additional hormonal  supplementation with OCPs versus proceeding with removal and possible reinsertion.  Patient notes that she and her partner may be thinking about conceiving again within the next year so would likely not have a new one placed.  Will give patient trial of Slynd to see if bleeding can be treated.  If she continues to experience issues can consider removal in the near future.  3. Encounter for hepatitis C screening test for low risk patient - Hepatitis C antibody  4. Cervical cancer screening - Cytology - PAP  5. Anxiety and depression -Patient with a history of anxiety and depression.  Noting some increase in her depression symptoms.  Would like to resume her medications however needs to reestablish with her psychiatrist.  I discussed a short-term supply until patient is able to reestablish.  Given prescription for 3 months.  6. Vulvar itching -Discussed that patient may not be dealing with an infectious cause of her vulvar itching.  May be more dermatologic in nature.  Advised on use coconut oil or tea tree oil in the vulvar region to see if this will help.  May also need to consider use of an mild steroid such as hydrocortisone if symptoms persist.  Follow up in 1 year for annual exam   Hildred Laser, MD [facility]

## 2023-04-15 ENCOUNTER — Ambulatory Visit (INDEPENDENT_AMBULATORY_CARE_PROVIDER_SITE_OTHER): Payer: Self-pay | Admitting: Family Medicine

## 2023-04-16 LAB — FOLLICLE STIMULATING HORMONE: FSH: 2.6 m[IU]/mL

## 2023-04-16 LAB — PROGESTERONE: Progesterone: 2.4 ng/mL

## 2023-04-16 LAB — HEPATITIS C ANTIBODY: Hep C Virus Ab: NONREACTIVE

## 2023-04-16 LAB — DHEA-SULFATE: DHEA-SO4: 358 ug/dL (ref 84.8–378.0)

## 2023-04-16 LAB — TESTOSTERONE, FREE, TOTAL, SHBG
Sex Hormone Binding: 42 nmol/L (ref 24.6–122.0)
Testosterone, Free: 5.8 pg/mL — ABNORMAL HIGH (ref 0.0–4.2)
Testosterone: 38 ng/dL (ref 13–71)

## 2023-04-16 LAB — INSULIN, RANDOM: INSULIN: 11.5 u[IU]/mL (ref 2.6–24.9)

## 2023-04-20 ENCOUNTER — Ambulatory Visit (INDEPENDENT_AMBULATORY_CARE_PROVIDER_SITE_OTHER): Payer: 59 | Admitting: Internal Medicine

## 2023-04-20 ENCOUNTER — Encounter (INDEPENDENT_AMBULATORY_CARE_PROVIDER_SITE_OTHER): Payer: Self-pay | Admitting: Internal Medicine

## 2023-04-20 VITALS — BP 96/61 | HR 58 | Temp 97.9°F | Ht 64.0 in | Wt 214.0 lb

## 2023-04-20 DIAGNOSIS — E88819 Insulin resistance, unspecified: Secondary | ICD-10-CM | POA: Diagnosis not present

## 2023-04-20 DIAGNOSIS — R5383 Other fatigue: Secondary | ICD-10-CM | POA: Diagnosis not present

## 2023-04-20 DIAGNOSIS — R0602 Shortness of breath: Secondary | ICD-10-CM

## 2023-04-20 DIAGNOSIS — Z6836 Body mass index (BMI) 36.0-36.9, adult: Secondary | ICD-10-CM | POA: Diagnosis not present

## 2023-04-20 DIAGNOSIS — Z1331 Encounter for screening for depression: Secondary | ICD-10-CM

## 2023-04-20 DIAGNOSIS — R948 Abnormal results of function studies of other organs and systems: Secondary | ICD-10-CM

## 2023-04-20 DIAGNOSIS — E669 Obesity, unspecified: Secondary | ICD-10-CM | POA: Insufficient documentation

## 2023-04-20 NOTE — Assessment & Plan Note (Signed)
Patient has a slower than predicted metabolism. IC 1483 vs. calculated 1697.This may contribute to weight gain, chronic fatigue and difficulty losing weight. We reviewed measures to improve metabolism including progressive strengthening exercises, increasing protein intake at every meal and maintaining adequate hydration and sleep.  She also needs to avoid chronic skipping of meals.

## 2023-04-20 NOTE — Progress Notes (Signed)
Chief Complaint:   OBESITY Barbara [name] (MR# [id number]) is a 32 y.o. female who presents for evaluation and treatment of obesity and related comorbidities. Current BMI is Body mass index is 36.73 kg/m. Barbara [name] has been struggling with her weight for many years and has been unsuccessful in either losing weight, maintaining weight loss, or reaching her healthy weight goal.  Barbara [name] is currently in the action stage of change and ready to dedicate time achieving and maintaining a healthier weight. Barbara [name] is interested in becoming our patient and working on intensive lifestyle modifications including (but not limited to) diet and exercise for weight loss.  Barbara [name]'s habits were reviewed today and are as follows: Her family eats meals together, she thinks her family will eat healthier with her, her desired weight loss is 54-64 lbs, she has been heavy most of her life, she started gaining weight at age 64 years old, her heaviest weight ever was 298 pounds, she has significant food cravings issues, she snacks frequently in the evenings, she skips meals frequently, she is frequently drinking liquids with calories, she frequently makes poor food choices, she has problems with excessive hunger, she frequently eats larger portions than normal, and she struggles with emotional eating.  Depression Screen Barbara [name]'s Food and Mood (modified PHQ-9) score was 20.  Subjective:   1. Other fatigue Barbara [name] admits to daytime somnolence and admits to waking up still tired. Patient has a history of symptoms of daytime fatigue and morning fatigue. Barbara [name] generally gets 4 or 12 hours of sleep per night, and states that she has nightime awakenings and generally restful sleep. Snoring is present. Apneic episodes are present. Epworth Sleepiness Score is 9.   2. SOBOE (shortness of breath on exertion) Barbara [name] notes increasing shortness of breath with exercising and seems to be worsening over time with weight gain. She  notes getting out of breath sooner with activity than she used to. This has not gotten worse recently. Barbara [name] denies shortness of breath at rest or orthopnea.  3. Abnormal metabolism Patient has a slower than predicted metabolism. IC 1483 vs. calculated 1697. This may contribute to weight gain, chronic fatigue and difficulty losing weight.  4. Insulin resistance Her HOMA-IR is is 2.49 which is elevated. Optimal level < 1.9. This is complex condition associated with genetics, ectopic fat and lifestyle factors. Insulin resistance may result in weight gain, abnormal cravings (particularly for carbs) and fatigue. This may result in additional weight gain and lead to pre-diabetes and diabetes if untreated.   No results found for: "HGBA1C" Lab Results  Component Value Date   INSULIN 11.5 04/14/2023   Lab Results  Component Value Date   GLUCOSE 88 03/05/2023   GLUCOSE 93 12/03/2013   Assessment/Plan:   1. Other fatigue Barbara [name] does feel that her weight is causing her energy to be lower than it should be. Fatigue may be related to obesity, depression or many other causes. Labs will be ordered, and in the meanwhile, Barbara [name] will focus on self care including making healthy food choices, increasing physical activity and focusing on stress reduction.  - EKG 12-Lead - Vitamin B12 - Hemoglobin A1c - Lipid panel - VITAMIN D 25 Hydroxy (Vit-D Deficiency, Fractures)  2. SOBOE (shortness of breath on exertion) Barbara [name] does feel that she gets out of breath more easily that she used to when she exercises. Barbara [name] shortness of breath appears to be obesity related and exercise induced. She has agreed to work on weight loss and gradually  increase exercise to treat her exercise induced shortness of breath. Will continue to monitor closely.  3. Abnormal metabolism We reviewed measures to improve metabolism including progressive strengthening exercises, increasing protein intake at every meal and maintaining  adequate hydration and sleep.  She also needs to avoid chronic skipping of meals.  - Lipid panel  4. Insulin resistance We reviewed treatment options which include losing 7 to 10% of body weight, increasing physical activity to a 150 minutes a week of moderate intensity. We also discussed the role of processed carbs and the incidence of insulin resistance. She may also be a candidate for pharmacoprophylaxis with metformin or incretin mimetic.   - Hemoglobin A1c  5. Depression screening Barbara [name] had a positive depression screening. Depression is commonly associated with obesity and often results in emotional eating behaviors. We will monitor this closely and work on CBT to help improve the non-hunger eating patterns. Referral to Psychology may be required if no improvement is seen as she continues in our clinic.  6. Class 2 severe obesity with serious comorbidity and body mass index (BMI) of 36.0 to 36.9 in adult, unspecified obesity type (HCC) - Vitamin B12 - Hemoglobin A1c - Lipid panel - VITAMIN D 25 Hydroxy (Vit-D Deficiency, Fractures)  Barbara [name] is currently in the action stage of change and her goal is to continue with weight loss efforts. I recommend Barbara [name] begin the structured treatment plan as follows:  She has agreed to the Category 2 Plan.  Exercise goals: No exercise has been prescribed at this time.   Behavioral modification strategies: increasing lean protein intake, decreasing simple carbohydrates, and increasing water intake.  She was informed of the importance of frequent follow-up visits to maximize her success with intensive lifestyle modifications for her multiple health conditions. She was informed we would discuss her lab results at her next visit unless there is a critical issue that needs to be addressed sooner. Barbara [name] agreed to keep her next visit at the agreed upon time to discuss these results.  Objective:   Blood pressure 96/61, pulse (!) 58, temperature 97.9 F  (36.6 C), height 5\' 4"  (1.626 m), weight 214 lb (97.1 kg), last menstrual period 04/15/2023, SpO2 98%. Body mass index is 36.73 kg/m.  EKG: Normal sinus rhythm, rate 67 BPM.  Indirect Calorimeter completed today shows a VO2 of 216 and a REE of 1483.  Her calculated basal metabolic rate is 1610 thus her basal metabolic rate is worse than expected.  General: Cooperative, alert, well developed, in no acute distress. HEENT: Conjunctivae and lids unremarkable. Cardiovascular: Regular rhythm.  Lungs: Normal work of breathing. Neurologic: No focal deficits.   Lab Results  Component Value Date   CREATININE 0.73 03/05/2023   BUN 11 03/05/2023   NA 142 03/05/2023   K 4.6 03/05/2023   CL 105 03/05/2023   CO2 22 03/05/2023   Lab Results  Component Value Date   ALT 12 03/05/2023   AST 12 03/05/2023   ALKPHOS 67 03/05/2023   BILITOT 0.2 03/05/2023   No results found for: "HGBA1C" Lab Results  Component Value Date   INSULIN 11.5 04/14/2023   Lab Results  Component Value Date   TSH 0.792 03/05/2023   No results found for: "CHOL", "HDL", "LDLCALC", "LDLDIRECT", "TRIG", "CHOLHDL" Lab Results  Component Value Date   WBC 6.0 03/05/2023   HGB 14.1 03/05/2023   HCT 42.7 03/05/2023   MCV 92 03/05/2023   PLT 195 08/02/2018   No results found for: "IRON", "TIBC", "FERRITIN"  Attestation Statements:   Reviewed by clinician on day of visit: allergies, medications, problem list, medical history, surgical history, family history, social history, and previous encounter notes.  Time spent on visit including pre-visit chart review and post-visit charting and care was 40 minutes.   Trude Mcburney, am acting as transcriptionist for Worthy Rancher, MD.  I have reviewed the above documentation for accuracy and completeness, and I agree with the above. -Worthy Rancher, MD

## 2023-04-20 NOTE — Assessment & Plan Note (Signed)
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 Optimal level < 1.9. This is complex condition associated with genetics, ectopic fat and lifestyle factors. Insulin resistance may result in weight gain, abnormal cravings (particularly for carbs) and fatigue. This may result in additional weight gain and lead to pre-diabetes and diabetes if untreated.   No results found for: "HGBA1C" Lab Results  Component Value Date   INSULIN 11.5 04/14/2023   Lab Results  Component Value Date   GLUCOSE 88 03/05/2023   GLUCOSE 93 12/03/2013    We reviewed treatment options which include losing 7 to 10% of body weight, increasing physical activity to a 150 minutes a week of moderate intensity.  We also discussed the role of processed carbs and the incidence of insulin resistance.  She may also be a candidate for pharmacoprophylaxis with metformin or incretin mimetic.

## 2023-04-21 LAB — LIPID PANEL
Chol/HDL Ratio: 4 ratio (ref 0.0–4.4)
Cholesterol, Total: 167 mg/dL (ref 100–199)
HDL: 42 mg/dL (ref >39)
LDL Chol Calc (NIH): 111 mg/dL — ABNORMAL HIGH (ref 0–99)
Triglycerides: 74 mg/dL (ref 0–149)
VLDL Cholesterol Cal: 14 mg/dL (ref 5–40)

## 2023-04-21 LAB — VITAMIN D 25 HYDROXY (VIT D DEFICIENCY, FRACTURES): Vit D, 25-Hydroxy: 28.3 ng/mL — ABNORMAL LOW (ref 30.0–100.0)

## 2023-04-21 LAB — HEMOGLOBIN A1C
Est. average glucose Bld gHb Est-mCnc: 108 mg/dL
Hgb A1c MFr Bld: 5.4 % (ref 4.8–5.6)

## 2023-04-21 LAB — VITAMIN B12: Vitamin B-12: 441 pg/mL (ref 232–1245)

## 2023-04-28 ENCOUNTER — Telehealth: Payer: Self-pay | Admitting: Physician Assistant

## 2023-04-28 NOTE — Telephone Encounter (Signed)
Patient called in because she needs a refill for linzess. She had an appointment with [facility], I advised her she will have to cancel that appointment with [facility] before we moved forward. She stated that no appointment should not have been made with [facility] and she is canceling now.

## 2023-04-29 LAB — CYTOLOGY - PAP
Comment: NEGATIVE
Diagnosis: REACTIVE

## 2023-04-29 MED ORDER — LINACLOTIDE 72 MCG PO CAPS: 72.0000 ug | ORAL_CAPSULE | Freq: Every day | ORAL | 3 refills | Status: DC

## 2023-04-29 NOTE — Addendum Note (Signed)
Addended by: Martyn Ehrich on: 04/29/2023 11:43 AM   Modules accepted: Orders

## 2023-04-29 NOTE — Telephone Encounter (Signed)
Patient wanted to know if there was any other way she can get the refill without coming in.

## 2023-05-05 ENCOUNTER — Ambulatory Visit (INDEPENDENT_AMBULATORY_CARE_PROVIDER_SITE_OTHER): Payer: Self-pay | Admitting: Family Medicine

## 2023-05-11 ENCOUNTER — Ambulatory Visit (INDEPENDENT_AMBULATORY_CARE_PROVIDER_SITE_OTHER): Payer: Self-pay | Admitting: Internal Medicine

## 2023-05-18 ENCOUNTER — Ambulatory Visit (INDEPENDENT_AMBULATORY_CARE_PROVIDER_SITE_OTHER): Payer: Self-pay | Admitting: Internal Medicine

## 2023-06-10 ENCOUNTER — Encounter (INDEPENDENT_AMBULATORY_CARE_PROVIDER_SITE_OTHER): Payer: Self-pay | Admitting: Internal Medicine

## 2023-06-10 ENCOUNTER — Ambulatory Visit (INDEPENDENT_AMBULATORY_CARE_PROVIDER_SITE_OTHER): Payer: 59 | Admitting: Internal Medicine

## 2023-06-10 VITALS — BP 125/82 | HR 81 | Temp 98.3°F | Ht 64.0 in | Wt 204.0 lb

## 2023-06-10 DIAGNOSIS — E88819 Insulin resistance, unspecified: Secondary | ICD-10-CM | POA: Diagnosis not present

## 2023-06-10 DIAGNOSIS — E559 Vitamin D deficiency, unspecified: Secondary | ICD-10-CM | POA: Diagnosis not present

## 2023-06-10 DIAGNOSIS — Z6835 Body mass index (BMI) 35.0-35.9, adult: Secondary | ICD-10-CM

## 2023-06-10 DIAGNOSIS — E669 Obesity, unspecified: Secondary | ICD-10-CM | POA: Diagnosis not present

## 2023-06-10 DIAGNOSIS — R948 Abnormal results of function studies of other organs and systems: Secondary | ICD-10-CM | POA: Diagnosis not present

## 2023-06-10 MED ORDER — VITAMIN D (ERGOCALCIFEROL) 1.25 MG (50000 UNIT) PO CAPS
50000.0000 [IU] | ORAL_CAPSULE | ORAL | 0 refills | Status: DC
Start: 2023-06-10 — End: 2023-10-26

## 2023-06-10 NOTE — Progress Notes (Signed)
Office: [phone]  /  Fax: [phone]  WEIGHT SUMMARY AND BIOMETRICS  Vitals Temp: 98.3 F (36.8 C) BP: 125/82 Pulse Rate: 81 SpO2: 99 %   Anthropometric Measurements Height: 5\' 4"  (1.626 m) Weight: 204 lb (92.5 kg) BMI (Calculated): 35 Weight at Last Visit: 214 lb Weight Lost Since Last Visit: 10 lb Weight Gained Since Last Visit: 0 lb Starting Weight: 214 lb Peak Weight: 298 lb   Body Composition  Body Fat %: 42.5 % Fat Mass (lbs): 86.8 lbs Muscle Mass (lbs): 111.6 lbs Total Body Water (lbs): 80.8 lbs Visceral Fat Rating : 9    RMR: 1483  Today's Visit #: 2  Starting Date: 04/19/26   HPI  Chief Complaint: OBESITY  Barbara [name] is here to discuss her progress with her obesity treatment plan. She is on the the Category 2 Plan and states she is following her eating plan approximately 80 % of the time. She states she is exercising 30 minutes 2 times per week.  Interval History:  Barbara [name] presents today for her second follow-up we had seen her back in July.  Since last office visit she has lost 10 pounds. She reports good adherence to reduced calorie nutritional plan. She has been working on not skipping meals, increasing protein intake at every meal, eating more vegetables, drinking more water, avoiding and / or reducing liquid calories, avoiding or reducing simple and processed carbohydrates, making healthier choices, and continues to exercise  Orexigenic Control: Denies problems with appetite and hunger signals.  Denies problems with satiety and satiation.  Denies problems with eating patterns and portion control.  Denies abnormal cravings. Reports feeling deprived or restricted.  She would like to be able to enjoy an occasional font size Snickers bar or Dione Plover and not feel bad about it.   Barriers identified: none.   Pharmacotherapy for weight loss: She is currently taking  she is on Wellbutrin with a primary indication of mood disorder.  This is being  managed by mental health care provider. .    ASSESSMENT AND PLAN  TREATMENT PLAN FOR OBESITY:  Recommended Dietary Goals  Barbara [name] is currently in the action stage of change. As such, her goal is to continue weight management plan. She has agreed to: continue current plan  Behavioral Intervention  We discussed the following Behavioral Modification Strategies today: increasing lean protein intake, decreasing simple carbohydrates , increasing vegetables, increasing lower glycemic fruits, increasing fiber rich foods, increasing water intake, work on meal planning and preparation, reading food labels , continue to practice mindfulness when eating, and planning for success.  Additional resources provided today: Eating out guide and Personalized instruction on the use of artificial intelligence for recipes, calorie tracking, and finding healthier options when eating out.   Recommended Physical Activity Goals  Barbara [name] has been advised to work up to 150 minutes of moderate intensity aerobic activity a week and strengthening exercises 2-3 times per week for cardiovascular health, weight loss maintenance and preservation of muscle mass.   She has agreed to :  Think about ways to increase daily physical activity and overcoming barriers to exercise and Increase physical activity in their day and reduce sedentary time (increase NEAT).  Pharmacotherapy We discussed various medication options to help Barbara [name] with her weight loss efforts and we both agreed to : continue with nutritional and behavioral strategies  ASSOCIATED CONDITIONS ADDRESSED TODAY  Vitamin D deficiency Assessment & Plan: Most recent vitamin D levels  Lab Results  Component Value Date   VD25OH  28.3 (L) 04/20/2023     Deficiency state associated with adiposity and may result in leptin resistance, weight gain and fatigue. Currently on vitamin D supplementation without any adverse effects.  Plan: After discussion of benefits,  alternative treatment options and side effects patient will be started on vitamin D2 50,000 units 1 tablet weekly for 3-4 months. for a treatment goal level of 50-60 mg/dl. Check levels at that time for response monitoring.   Orders: -     Vitamin D (Ergocalciferol); Take 1 capsule (50,000 Units total) by mouth every 7 (seven) days.  Dispense: 12 capsule; Refill: 0  Abnormal metabolism Assessment & Plan: Patient has a slower than predicted metabolism. IC 1483 vs. calculated 1697. It is likely secondary to adaptive thermogenesis due to weight loss. This may contribute to weight gain, chronic fatigue and difficulty losing weight. We reviewed measures to improve metabolism including progressive strengthening exercises, increasing protein intake at every meal and maintaining adequate hydration and sleep.  She also needs to avoid chronic skipping of meals.    Obesity with a peak BMI of 51 current BMI of 35 Assessment & Plan: Her peak weight was 298 she is now down to 204.  Her metabolism is slightly reduced secondary to adaptive thermogenesis from her weight loss.  We reviewed ways to enhance metabolism.  She is doing a good job nutrition wise I would like for her to increase the volume of physical activity with emphasis on strengthening.  We also want her to get about 30 to 40 g of protein per meal.   Generalized obesity  Insulin resistance Assessment & Plan: Her HOMA-IR is is 2.49 which is elevated. Optimal level < 1.9. This is complex condition associated with genetics, ectopic fat and lifestyle factors. Insulin resistance may result in weight gain, abnormal cravings (particularly for carbs) and fatigue. This may result in additional weight gain and lead to pre-diabetes and diabetes if untreated.   Lab Results  Component Value Date   HGBA1C 5.4 04/20/2023   Lab Results  Component Value Date   INSULIN 11.5 04/14/2023   Lab Results  Component Value Date   GLUCOSE 88 03/05/2023   GLUCOSE  93 12/03/2013    We reviewed treatment options which include losing 7 to 10% of body weight, increasing physical activity to a 150 minutes a week of moderate intensity.  We also discussed the role of processed carbs and the incidence of insulin resistance.  She may also be a candidate for pharmacoprophylaxis with metformin or incretin mimetic.       PHYSICAL EXAM:  Blood pressure 125/82, pulse 81, temperature 98.3 F (36.8 C), height 5\' 4"  (1.626 m), weight 204 lb (92.5 kg), last menstrual period 06/02/2023, SpO2 99%. Body mass index is 35.02 kg/m.  General: She is overweight, cooperative, alert, well developed, and in no acute distress. PSYCH: Has normal mood, affect and thought process.   HEENT: EOMI, sclerae are anicteric. Lungs: Normal breathing effort, no conversational dyspnea. Extremities: No edema.  Neurologic: No gross sensory or motor deficits. No tremors or fasciculations noted.    DIAGNOSTIC DATA REVIEWED:  BMET    Component Value Date/Time   NA 142 03/05/2023 1145   NA 140 12/03/2013 1939   K 4.6 03/05/2023 1145   K 3.9 12/03/2013 1939   CL 105 03/05/2023 1145   CL 112 (H) 12/03/2013 1939   CO2 22 03/05/2023 1145   CO2 20 (L) 12/03/2013 1939   GLUCOSE 88 03/05/2023 1145   GLUCOSE 93 12/03/2013  1939   BUN 11 03/05/2023 1145   BUN 17 12/03/2013 1939   CREATININE 0.73 03/05/2023 1145   CREATININE 0.74 12/03/2013 1939   CALCIUM 9.3 03/05/2023 1145   CALCIUM 8.9 12/03/2013 1939   GFRNONAA >60 12/03/2013 1939   GFRAA >60 12/03/2013 1939   Lab Results  Component Value Date   HGBA1C 5.4 04/20/2023   Lab Results  Component Value Date   INSULIN 11.5 04/14/2023   Lab Results  Component Value Date   TSH 0.792 03/05/2023   CBC    Component Value Date/Time   WBC 6.0 03/05/2023 1145   WBC 17.1 (H) 08/02/2018 0559   RBC 4.66 03/05/2023 1145   RBC 3.66 (L) 08/02/2018 0559   HGB 14.1 03/05/2023 1145   HCT 42.7 03/05/2023 1145   PLT 195 08/02/2018  0559   PLT 220 05/09/2018 0949   MCV 92 03/05/2023 1145   MCV 93 12/03/2013 1939   MCH 30.3 03/05/2023 1145   MCH 29.5 08/02/2018 0559   MCHC 33.0 03/05/2023 1145   MCHC 32.2 08/02/2018 0559   RDW 12.0 03/05/2023 1145   RDW 12.2 12/03/2013 1939   Iron Studies No results found for: "IRON", "TIBC", "FERRITIN", "IRONPCTSAT" Lipid Panel     Component Value Date/Time   CHOL 167 04/20/2023 0905   TRIG 74 04/20/2023 0905   HDL 42 04/20/2023 0905   CHOLHDL 4.0 04/20/2023 0905   LDLCALC 111 (H) 04/20/2023 0905   Hepatic Function Panel     Component Value Date/Time   PROT 6.4 03/05/2023 1145   PROT 7.8 12/03/2013 1939   ALBUMIN 4.3 03/05/2023 1145   ALBUMIN 3.9 12/03/2013 1939   AST 12 03/05/2023 1145   AST 11 (L) 12/03/2013 1939   ALT 12 03/05/2023 1145   ALT 22 12/03/2013 1939   ALKPHOS 67 03/05/2023 1145   ALKPHOS 63 12/03/2013 1939   BILITOT 0.2 03/05/2023 1145   BILITOT 1.1 (H) 12/03/2013 1939      Component Value Date/Time   TSH 0.792 03/05/2023 1145   Nutritional Lab Results  Component Value Date   VD25OH 28.3 (L) 04/20/2023     Return for Week of Sept 30th - 4 weeks - with Dr. Rikki Spearing.Marland Kitchen She was informed of the importance of frequent follow up visits to maximize her success with intensive lifestyle modifications for her multiple health conditions.   ATTESTASTION STATEMENTS:  Reviewed by clinician on day of visit: allergies, medications, problem list, medical history, surgical history, family history, social history, and previous encounter notes.   I have spent 40 minutes in the care of the patient today including: preparing to see patient (e.g. review and interpretation of tests, old notes ), obtaining and/or reviewing separately obtained history, performing a medically appropriate examination or evaluation, counseling and educating the patient, documenting clinical information in the electronic or other health care record, and independently interpreting  results and communicating results to the patient, family, or caregiver   Worthy Rancher, MD

## 2023-06-11 DIAGNOSIS — E559 Vitamin D deficiency, unspecified: Secondary | ICD-10-CM | POA: Insufficient documentation

## 2023-06-11 NOTE — Assessment & Plan Note (Signed)
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 Optimal level < 1.9. This is complex condition associated with genetics, ectopic fat and lifestyle factors. Insulin resistance may result in weight gain, abnormal cravings (particularly for carbs) and fatigue. This may result in additional weight gain and lead to pre-diabetes and diabetes if untreated.   Lab Results  Component Value Date   HGBA1C 5.4 04/20/2023   Lab Results  Component Value Date   INSULIN 11.5 04/14/2023   Lab Results  Component Value Date   GLUCOSE 88 03/05/2023   GLUCOSE 93 12/03/2013    We reviewed treatment options which include losing 7 to 10% of body weight, increasing physical activity to a 150 minutes a week of moderate intensity.  We also discussed the role of processed carbs and the incidence of insulin resistance.  She may also be a candidate for pharmacoprophylaxis with metformin or incretin mimetic.

## 2023-06-11 NOTE — Assessment & Plan Note (Signed)
Patient has a slower than predicted metabolism. IC 1483 vs. calculated 1697. It is likely secondary to adaptive thermogenesis due to weight loss. This may contribute to weight gain, chronic fatigue and difficulty losing weight. We reviewed measures to improve metabolism including progressive strengthening exercises, increasing protein intake at every meal and maintaining adequate hydration and sleep.  She also needs to avoid chronic skipping of meals.

## 2023-06-11 NOTE — Assessment & Plan Note (Signed)
Her peak weight was 298 she is now down to 204.  Her metabolism is slightly reduced secondary to adaptive thermogenesis from her weight loss.  We reviewed ways to enhance metabolism.  She is doing a good job nutrition wise I would like for her to increase the volume of physical activity with emphasis on strengthening.  We also want her to get about 30 to 40 g of protein per meal.

## 2023-06-11 NOTE — Assessment & Plan Note (Signed)
Most recent vitamin D levels  Lab Results  Component Value Date   VD25OH 28.3 (L) 04/20/2023     Deficiency state associated with adiposity and may result in leptin resistance, weight gain and fatigue. Currently on vitamin D supplementation without any adverse effects.  Plan: After discussion of benefits, alternative treatment options and side effects patient will be started on vitamin D2 50,000 units 1 tablet weekly for 3-4 months. for a treatment goal level of 50-60 mg/dl. Check levels at that time for response monitoring.

## 2023-06-11 NOTE — Assessment & Plan Note (Deleted)
Her peak weight was 298 she is now down to 204.  Her metabolism is slightly reduced secondary to adaptive thrombogenesis from her weight loss.  We reviewed ways to enhance metabolism.  She is doing a good job nutrition wise I would like for her to increase the volume of physical activity with emphasis on strengthening.  We also want her to get about 30 to 40 g of protein per meal.

## 2023-07-13 ENCOUNTER — Ambulatory Visit: Payer: 59 | Admitting: Obstetrics and Gynecology

## 2023-07-19 ENCOUNTER — Ambulatory Visit (INDEPENDENT_AMBULATORY_CARE_PROVIDER_SITE_OTHER): Payer: 59 | Admitting: Internal Medicine

## 2023-07-19 ENCOUNTER — Encounter: Payer: Self-pay | Admitting: Obstetrics & Gynecology

## 2023-07-19 ENCOUNTER — Other Ambulatory Visit (HOSPITAL_COMMUNITY)
Admission: RE | Admit: 2023-07-19 | Discharge: 2023-07-19 | Disposition: A | Discharge status: Home / Self Care | Payer: 59 | Source: Ambulatory Visit | Attending: Obstetrics and Gynecology | Admitting: Obstetrics and Gynecology

## 2023-07-19 ENCOUNTER — Ambulatory Visit: Payer: 59 | Admitting: Obstetrics & Gynecology

## 2023-07-19 VITALS — BP 125/83 | HR 96 | Wt 199.7 lb

## 2023-07-19 DIAGNOSIS — N921 Excessive and frequent menstruation with irregular cycle: Secondary | ICD-10-CM

## 2023-07-19 DIAGNOSIS — Z30432 Encounter for removal of intrauterine contraceptive device: Secondary | ICD-10-CM

## 2023-07-19 DIAGNOSIS — L292 Pruritus vulvae: Secondary | ICD-10-CM | POA: Diagnosis present

## 2023-07-19 NOTE — Progress Notes (Signed)
    GYNECOLOGY PROGRESS NOTE  Subjective:    Patient ID: Barbara [name], female    DOB: 1991-03-22, 32 y.o.   MRN: [id number]  HPI  Patient is a 32 y.o. G2P1011 here to have her Mirena removed. She has been experiencing BTB and is also considering conception in the near future. She also reports a 10 year h/o vulvar itching. She has tried "every OTC cream that there is" with no relief. This itching comes and goes.  The following portions of the patient's history were reviewed and updated as appropriate: allergies, current medications, past family history, past medical history, past social history, past surgical history, and problem list.  Review of Systems Pertinent items are noted in HPI.   Objective:   There were no vitals taken for this visit. There is no height or weight on file to calculate BMI. Well nourished, well hydrated White female, no apparent distress She is ambulating and conversing normally. EG- right inner labia majora with some excoriations Speculum exam reveals a mildly frothy vaginal discharge IUD was easily removed and noted to be intact.   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 She will use withdrawal for contraception right now. She will start PNVs daily.  Plan:   Start PNVs daily As above If all labs normal, then rec vulvar biopsy Rec start with nightly boric acid suppositories

## 2023-07-20 ENCOUNTER — Encounter: Payer: Self-pay | Admitting: Obstetrics & Gynecology

## 2023-07-20 LAB — CERVICOVAGINAL ANCILLARY ONLY
Bacterial Vaginitis (gardnerella): NEGATIVE
Candida Glabrata: NEGATIVE
Candida Vaginitis: NEGATIVE
Chlamydia: NEGATIVE
Comment: NEGATIVE
Comment: NEGATIVE
Comment: NEGATIVE
Comment: NEGATIVE
Comment: NEGATIVE
Comment: NORMAL
Neisseria Gonorrhea: NEGATIVE
Trichomonas: NEGATIVE

## 2023-07-20 LAB — HSV 1 AND 2 AB, IGG
HSV 1 Glycoprotein G Ab, IgG: NONREACTIVE
HSV 2 IgG, Type Spec: NONREACTIVE

## 2023-07-21 ENCOUNTER — Telehealth: Payer: Self-pay

## 2023-07-21 NOTE — Telephone Encounter (Signed)
Called pt to f/u on after hours call with complaints of mild vaginal bleeding. No answer, LVMTRC.

## 2023-07-22 ENCOUNTER — Ambulatory Visit (INDEPENDENT_AMBULATORY_CARE_PROVIDER_SITE_OTHER): Payer: 59 | Admitting: Internal Medicine

## 2023-07-26 ENCOUNTER — Ambulatory Visit: Payer: 59 | Admitting: Obstetrics & Gynecology

## 2023-07-27 ENCOUNTER — Ambulatory Visit (INDEPENDENT_AMBULATORY_CARE_PROVIDER_SITE_OTHER): Payer: 59 | Admitting: Internal Medicine

## 2023-08-02 ENCOUNTER — Ambulatory Visit (INDEPENDENT_AMBULATORY_CARE_PROVIDER_SITE_OTHER): Payer: 59 | Admitting: Obstetrics & Gynecology

## 2023-08-02 ENCOUNTER — Other Ambulatory Visit (HOSPITAL_COMMUNITY)
Admission: RE | Admit: 2023-08-02 | Discharge: 2023-08-02 | Disposition: A | Discharge status: Home / Self Care | Payer: 59 | Source: Ambulatory Visit | Attending: Obstetrics & Gynecology | Admitting: Obstetrics & Gynecology

## 2023-08-02 VITALS — BP 133/92 | HR 87 | Ht 64.0 in | Wt 198.5 lb

## 2023-08-02 DIAGNOSIS — L292 Pruritus vulvae: Secondary | ICD-10-CM | POA: Insufficient documentation

## 2023-08-02 DIAGNOSIS — N904 Leukoplakia of vulva: Secondary | ICD-10-CM

## 2023-08-02 NOTE — Progress Notes (Signed)
    GYNECOLOGY PROGRESS NOTE  Subjective:    Patient ID: Barbara [name], female    DOB: 13-Jan-1991, 32 y.o.   MRN: [id number]  HPI  Patient is a 32 y.o. K2H0623 here for vulvar biopsy. She has had vulvar itching (various spots) for more than 10 years. She says that she has tried steroids along with "every OTC cream that there is" with no relief.  I did HSV IgG testing last visit and this was negative for #1 and #2.   The following portions of the patient's history were reviewed and updated as appropriate: allergies, current medications, past family history, past medical history, past social history, past surgical history, and problem list.  Review of Systems Pertinent items are noted in HPI.  Well nourished, well hydrated White female, no apparent distress She is ambulating and conversing normally. I prepped the right inner left labia minora with Hurricaine spray and then betadine after consenting her for a vulvar biopsy. I then injected 1 mL of 1% lidocaine with a 25 gauge needle. I used a 4 mm punch biopsy to obtain specimen. Silver nitrate was used to achieve hemostasis. She tolerated the procedure well.  Objective:   Blood pressure (!) 133/92, pulse 87, height 5\' 4"  (1.626 m), weight 198 lb 8 oz (90 kg), last menstrual period 07/19/2023. Body mass index is 34.07 kg/m.   Assessment:   Vulvar itching  Plan:  Await pathology. She also has a dermatology appt scheduled

## 2023-08-05 ENCOUNTER — Ambulatory Visit: Payer: 59 | Admitting: Obstetrics and Gynecology

## 2023-08-05 LAB — SURGICAL PATHOLOGY

## 2023-08-09 ENCOUNTER — Other Ambulatory Visit: Payer: Self-pay | Admitting: Obstetrics & Gynecology

## 2023-08-09 ENCOUNTER — Other Ambulatory Visit: Payer: Self-pay

## 2023-08-09 ENCOUNTER — Encounter: Payer: Self-pay | Admitting: Obstetrics & Gynecology

## 2023-08-09 DIAGNOSIS — L292 Pruritus vulvae: Secondary | ICD-10-CM

## 2023-08-09 MED ORDER — CLOBETASOL PROPIONATE 0.05 % EX OINT: TOPICAL_OINTMENT | CUTANEOUS | 5 refills | Status: AC

## 2023-08-09 MED ORDER — CLOBETASOL PROPIONATE 0.05 % EX OINT: TOPICAL_OINTMENT | CUTANEOUS | 5 refills | Status: DC

## 2023-08-09 NOTE — Progress Notes (Signed)
Temovate prescribed. Patient notified

## 2023-08-19 ENCOUNTER — Ambulatory Visit (INDEPENDENT_AMBULATORY_CARE_PROVIDER_SITE_OTHER): Payer: 59 | Admitting: Internal Medicine

## 2023-09-07 ENCOUNTER — Ambulatory Visit (INDEPENDENT_AMBULATORY_CARE_PROVIDER_SITE_OTHER): Payer: 59 | Admitting: Internal Medicine

## 2023-09-30 ENCOUNTER — Other Ambulatory Visit (INDEPENDENT_AMBULATORY_CARE_PROVIDER_SITE_OTHER): Payer: Self-pay | Admitting: Internal Medicine

## 2023-09-30 DIAGNOSIS — E559 Vitamin D deficiency, unspecified: Secondary | ICD-10-CM

## 2023-10-26 ENCOUNTER — Encounter (INDEPENDENT_AMBULATORY_CARE_PROVIDER_SITE_OTHER): Payer: Self-pay | Admitting: Internal Medicine

## 2023-10-26 ENCOUNTER — Ambulatory Visit (INDEPENDENT_AMBULATORY_CARE_PROVIDER_SITE_OTHER): Payer: 59 | Admitting: Internal Medicine

## 2023-10-26 VITALS — BP 118/87 | HR 110 | Temp 98.3°F | Ht 64.0 in | Wt 195.0 lb

## 2023-10-26 DIAGNOSIS — Z6836 Body mass index (BMI) 36.0-36.9, adult: Secondary | ICD-10-CM | POA: Diagnosis not present

## 2023-10-26 DIAGNOSIS — E66812 Obesity, class 2: Secondary | ICD-10-CM | POA: Diagnosis not present

## 2023-10-26 DIAGNOSIS — R948 Abnormal results of function studies of other organs and systems: Secondary | ICD-10-CM | POA: Diagnosis not present

## 2023-10-26 DIAGNOSIS — E88819 Insulin resistance, unspecified: Secondary | ICD-10-CM | POA: Diagnosis not present

## 2023-10-26 DIAGNOSIS — Z3A01 Less than 8 weeks gestation of pregnancy: Secondary | ICD-10-CM

## 2023-10-26 MED ORDER — PRENATAL VITAMINS 27-0.8 MG PO TABS: 1.0000 | ORAL_TABLET | Freq: Once | ORAL | Status: AC

## 2023-10-26 NOTE — Progress Notes (Unsigned)
Office: [phone]  /  Fax: [phone]  Weight Summary And Biometrics  Vitals Temp: 98.3 F (36.8 C) BP: 118/87 Pulse Rate: (!) 110 SpO2: 99 %   Anthropometric Measurements Height: 5\' 4"  (1.626 m) Weight: 195 lb (88.5 kg) BMI (Calculated): 33.46 Weight at Last Visit: 204 lb Weight Lost Since Last Visit: 9 lb Weight Gained Since Last Visit: 0 lb Starting Weight: 214 lb Total Weight Loss (lbs): 9 lb (4.082 kg) Peak Weight: 298 lb   Body Composition  Body Fat %: 39.2 % Fat Mass (lbs): 76.6 lbs Muscle Mass (lbs): 112.8 lbs Total Body Water (lbs): 75.8 lbs Visceral Fat Rating : 8    No data recorded Today's Visit #: 3  Starting Date: 04/20/23   Subjective   Chief Complaint: Obesity  Barbara [name] is here to discuss her progress with her obesity treatment plan. She is on the the Category 2 Plan and states she is following her eating plan approximately 75 % of the time. She states she is exercising 20-30 minutes 6 times per week.  Weight Progress Since Last Visit:  Since last office visit she has {emweight change:30888}. She reports {EMADHERENCE:28838::"good adherence to reduced calorie nutritional plan."} She has been working on [employer] labels","not skipping meals","increasing protein intake at every meal","drinking more water","making healthier choices","reducing portion sizes","incorporating more whole foods"}   Challenges affecting patient progress: {EMOBESITYBARRIERS:28841}.   Orexigenic Control: {Actions; denies-reports:32002} problems with appetite and hunger signals.  {Actions; denies-reports:32002} problems with satiety and satiation.  {Actions; denies-reports:32002} problems with eating patterns and portion control.  {Actions; denies-reports:32002} abnormal cravings. {Actions; denies-reports:32002} feeling deprived or restricted.   Pharmacotherapy for weight management: She is currently taking {EMPharmaco:28845}.   Assessment  and Plan   Treatment Plan For Obesity:  Recommended Dietary Goals  Barbara [name] is currently in the action stage of change. As such, her goal is to continue weight management plan. She has agreed to: {EMWTLOSSPLAN:29297::"continue current plan"}  Behavioral Health and Counseling  We discussed the following behavioral modification strategies today: {EMWMwtlossstrategies:28914::"continue to work on maintaining a reduced calorie state, getting the recommended amount of protein, incorporating whole foods, making healthy choices, staying well hydrated and practicing mindfulness when eating."}.  Additional education and resources provided today: {EMadditionalresources:29169::"None"}  Recommended Physical Activity Goals  Barbara [name] has been advised to work up to 150 minutes of moderate intensity aerobic activity a week and strengthening exercises 2-3 times per week for cardiovascular health, weight loss maintenance and preservation of muscle mass.   She has agreed to :  {EMEXERCISE:28847::"Think about enjoyable ways to increase daily physical activity and overcoming barriers to exercise","Increase physical activity in their day and reduce sedentary time (increase NEAT)."}  Pharmacotherapy  We discussed various medication options to help Barbara [name] with her weight loss efforts and we both agreed to : {EMagreedrx:29170}  Associated Conditions Impacted by Obesity Treatment  There are no diagnoses linked to this encounter.   Objective   Physical Exam:  Blood pressure 118/87, pulse (!) 110, temperature 98.3 F (36.8 C), height 5\' 4"  (1.626 m), weight 195 lb (88.5 kg), last menstrual period 10/02/2023, SpO2 99%. Body mass index is 33.47 kg/m.  General: She is overweight, cooperative, alert, well developed, and in no acute distress. PSYCH: Has normal mood, affect and thought process.   HEENT: EOMI, sclerae are anicteric. Lungs: Normal breathing effort, no conversational dyspnea. Extremities: No  edema.  Neurologic: No gross sensory or motor deficits. No tremors or fasciculations noted.    Diagnostic Data Reviewed:  BMET  Component Value Date/Time   NA 142 03/05/2023 1145   NA 140 12/03/2013 1939   K 4.6 03/05/2023 1145   K 3.9 12/03/2013 1939   CL 105 03/05/2023 1145   CL 112 (H) 12/03/2013 1939   CO2 22 03/05/2023 1145   CO2 20 (L) 12/03/2013 1939   GLUCOSE 88 03/05/2023 1145   GLUCOSE 93 12/03/2013 1939   BUN 11 03/05/2023 1145   BUN 17 12/03/2013 1939   CREATININE 0.73 03/05/2023 1145   CREATININE 0.74 12/03/2013 1939   CALCIUM 9.3 03/05/2023 1145   CALCIUM 8.9 12/03/2013 1939   GFRNONAA >60 12/03/2013 1939   GFRAA >60 12/03/2013 1939   Lab Results  Component Value Date   HGBA1C 5.4 04/20/2023   Lab Results  Component Value Date   INSULIN 11.5 04/14/2023   Lab Results  Component Value Date   TSH 0.792 03/05/2023   CBC    Component Value Date/Time   WBC 6.0 03/05/2023 1145   WBC 17.1 (H) 08/02/2018 0559   RBC 4.66 03/05/2023 1145   RBC 3.66 (L) 08/02/2018 0559   HGB 14.1 03/05/2023 1145   HCT 42.7 03/05/2023 1145   PLT 195 08/02/2018 0559   PLT 220 05/09/2018 0949   MCV 92 03/05/2023 1145   MCV 93 12/03/2013 1939   MCH 30.3 03/05/2023 1145   MCH 29.5 08/02/2018 0559   MCHC 33.0 03/05/2023 1145   MCHC 32.2 08/02/2018 0559   RDW 12.0 03/05/2023 1145   RDW 12.2 12/03/2013 1939   Iron Studies No results found for: "IRON", "TIBC", "FERRITIN", "IRONPCTSAT" Lipid Panel     Component Value Date/Time   CHOL 167 04/20/2023 0905   TRIG 74 04/20/2023 0905   HDL 42 04/20/2023 0905   CHOLHDL 4.0 04/20/2023 0905   LDLCALC 111 (H) 04/20/2023 0905   Hepatic Function Panel     Component Value Date/Time   PROT 6.4 03/05/2023 1145   PROT 7.8 12/03/2013 1939   ALBUMIN 4.3 03/05/2023 1145   ALBUMIN 3.9 12/03/2013 1939   AST 12 03/05/2023 1145   AST 11 (L) 12/03/2013 1939   ALT 12 03/05/2023 1145   ALT 22 12/03/2013 1939   ALKPHOS 67 03/05/2023  1145   ALKPHOS 63 12/03/2013 1939   BILITOT 0.2 03/05/2023 1145   BILITOT 1.1 (H) 12/03/2013 1939      Component Value Date/Time   TSH 0.792 03/05/2023 1145   Nutritional Lab Results  Component Value Date   VD25OH 28.3 (L) 04/20/2023    Follow-Up   No follow-ups on file.Marland Kitchen She was informed of the importance of frequent follow up visits to maximize her success with intensive lifestyle modifications for her multiple health conditions.  Attestation Statement   Reviewed by clinician on day of visit: allergies, medications, problem list, medical history, surgical history, family history, social history, and previous encounter notes.     Worthy Rancher, MD

## 2023-10-27 DIAGNOSIS — Z3A01 Less than 8 weeks gestation of pregnancy: Secondary | ICD-10-CM | POA: Insufficient documentation

## 2023-10-27 NOTE — Assessment & Plan Note (Signed)
Patient has a slower than predicted metabolism. IC 1483 vs. calculated 1697. It is likely secondary to adaptive thermogenesis due to weight loss. This may contribute to weight gain, chronic fatigue and difficulty losing weight. We have reviewed measures to improve metabolism including progressive strengthening exercises, increasing protein intake at every meal and maintaining adequate hydration and sleep.  She also needs to avoid chronic skipping of meals.

## 2023-10-27 NOTE — Assessment & Plan Note (Signed)
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 Optimal level < 1.9. This is complex condition associated with genetics, ectopic fat and lifestyle factors. Insulin resistance may result in weight gain, abnormal cravings (particularly for carbs) and fatigue. This may result in additional weight gain and lead to pre-diabetes and diabetes if untreated.   Lab Results  Component Value Date   HGBA1C 5.4 04/20/2023   Lab Results  Component Value Date   INSULIN 11.5 04/14/2023   Lab Results  Component Value Date   GLUCOSE 88 03/05/2023   GLUCOSE 93 12/03/2013    We reviewed treatment options which include losing 7 to 10% of body weight, increasing physical activity to a 150 minutes a week of moderate intensity.  We discussed the importance of maintaining a balanced diet.  She was provided with a 7-day meal plan today.

## 2023-10-27 NOTE — Assessment & Plan Note (Signed)
Patient is relatively new to our program, she is losing weight via nutritional changes and increasing physical activity levels.  Bioimpedance information shows a reduction in body fat, visceral fat rating and preservation of muscle mass.  She is currently following a 1200-calorie high-protein meal plan.  She acknowledges feeling hungry the day she works out.  Because of pregnancy I recommend she increase her calories to 1400.  Because of BMI the recommended amount of gestational weight gain is between 11 to 20 pounds.  She is scheduled to see her obstetrician in the near future and was advised to review recommendations with provider.  On average she will likely need 300-400 more calories and 400 to 500 cal in the second and third trimester.  Emphasis should maintaining a healthy weight throughout the pregnancy.

## 2023-10-27 NOTE — Assessment & Plan Note (Signed)
Patient is scheduled to see obstetrics in the near future.  She has been taking a prenatal vitamin.  We reviewed her psychotropic medication.  She is on bupropion, BuSpar, and Abilify.  Bupropion is inconclusive for congenital heart defects.  Her blood pressure and heart rate are also elevated today.  She has been trying to reach her psychiatrist but has not been successful.  I recommend that she stop the bupropion until she receives further guidance from her psychiatrist.  Abilify may result in neonatal extraparametal withdrawal symptoms.  Teratogenicity not expected.

## 2023-10-28 ENCOUNTER — Telehealth: Payer: Self-pay

## 2023-10-28 NOTE — Telephone Encounter (Signed)
Patient states she had a positive home pregnancy test Sunday evening and had a negative test today. After receiving the negative test she began spotting. Her cycle was due 10/25/23. Advised spotting can be normal in early pregnancy, monitor bleeding, when to report to Emergency Department and also call back if bleeding increases like period to notify as she is scheduled for confirmation nurse visit on 11/09/23.

## 2023-10-29 ENCOUNTER — Encounter: Payer: Self-pay | Admitting: Obstetrics and Gynecology

## 2023-11-05 ENCOUNTER — Other Ambulatory Visit: Payer: Self-pay

## 2023-11-05 DIAGNOSIS — O039 Complete or unspecified spontaneous abortion without complication: Secondary | ICD-10-CM

## 2023-11-09 ENCOUNTER — Ambulatory Visit: Payer: 59

## 2023-11-09 ENCOUNTER — Other Ambulatory Visit: Payer: 59

## 2023-11-10 ENCOUNTER — Encounter: Payer: Self-pay | Admitting: Obstetrics and Gynecology

## 2023-11-10 LAB — BETA HCG QUANT (REF LAB): hCG Quant: <1 m[IU]/mL

## 2023-11-10 NOTE — Telephone Encounter (Signed)
 Patient calling to discuss Beta Hcg results. Advised results show not pregnant. Patient inquiring if it determines if she had a miscarriage. Advised unable to know for sure without a prior beta showing positive pregnancy. Also advised will send information in my chart for her to review.

## 2023-12-20 ENCOUNTER — Ambulatory Visit (INDEPENDENT_AMBULATORY_CARE_PROVIDER_SITE_OTHER): Payer: 59 | Admitting: Internal Medicine

## 2023-12-23 ENCOUNTER — Encounter (INDEPENDENT_AMBULATORY_CARE_PROVIDER_SITE_OTHER): Payer: Self-pay

## 2023-12-27 ENCOUNTER — Encounter (INDEPENDENT_AMBULATORY_CARE_PROVIDER_SITE_OTHER): Payer: Self-pay

## 2024-01-11 ENCOUNTER — Ambulatory Visit (INDEPENDENT_AMBULATORY_CARE_PROVIDER_SITE_OTHER): Admitting: Internal Medicine

## 2024-01-11 ENCOUNTER — Encounter (INDEPENDENT_AMBULATORY_CARE_PROVIDER_SITE_OTHER): Payer: Self-pay

## 2024-01-18 ENCOUNTER — Encounter (INDEPENDENT_AMBULATORY_CARE_PROVIDER_SITE_OTHER): Payer: Self-pay | Admitting: Family Medicine

## 2024-01-18 ENCOUNTER — Ambulatory Visit (INDEPENDENT_AMBULATORY_CARE_PROVIDER_SITE_OTHER): Admitting: Family Medicine

## 2024-01-18 VITALS — BP 125/70 | HR 84 | Temp 97.8°F | Ht 64.0 in | Wt 210.0 lb

## 2024-01-18 DIAGNOSIS — Z6836 Body mass index (BMI) 36.0-36.9, adult: Secondary | ICD-10-CM | POA: Diagnosis not present

## 2024-01-18 DIAGNOSIS — E88819 Insulin resistance, unspecified: Secondary | ICD-10-CM | POA: Diagnosis not present

## 2024-01-18 DIAGNOSIS — E669 Obesity, unspecified: Secondary | ICD-10-CM

## 2024-01-18 DIAGNOSIS — E282 Polycystic ovarian syndrome: Secondary | ICD-10-CM | POA: Diagnosis not present

## 2024-01-18 MED ORDER — METFORMIN HCL 500 MG PO TABS
500.0000 mg | ORAL_TABLET | Freq: Every day | ORAL | 0 refills | Status: AC
Start: 2024-01-18 — End: 2024-02-17

## 2024-01-18 NOTE — Progress Notes (Signed)
 Office: [phone]  /  Fax: [phone]  WEIGHT SUMMARY AND BIOMETRICS  Anthropometric Measurements Height: 5\' 4"  (1.626 m) Weight: 210 lb (95.3 kg) BMI (Calculated): 36.03 Weight at Last Visit: 195lb Weight Lost Since Last Visit: 0lb Weight Gained Since Last Visit: 15lb Starting Weight: 214lb Total Weight Loss (lbs): 0 lb (0 kg) Peak Weight: 298lb   Body Composition  Body Fat %: 43.1 % Fat Mass (lbs): 90.6 lbs Muscle Mass (lbs): 113.8 lbs Total Body Water (lbs): 81.4 lbs Visceral Fat Rating : 9   Other Clinical Data Fasting: No Labs: No Today's Visit #: 4 Starting Date: 04/20/23    Chief Complaint: OBESITY   History of Present Illness Barbara [name] is a 33 year old female with insulin resistance and PCOS who presents for weight management and fertility planning.  She has experienced a weight gain of 15 pounds over the last three months. During this period, she had a miscarriage before six weeks of pregnancy and is still coping with this event. She is currently following her category two eating plan 80% of the time and engages in weightlifting for 30 minutes twice a week.  She has a history of insulin resistance, which she manages through diet, exercise, and weight loss. Her glucose levels have been stable, but her insulin levels remain elevated. She is not fully aware of the metabolic implications of insulin resistance but understands it is related to her PCOS.  She has a diagnosis of PCOS, which she understands is linked to her insulin resistance. She recently discontinued her IUD birth control in October after five years of use, contributing to hormonal fluctuations. She is actively trying to conceive again.  She is not currently on metformin but is familiar with its use in treating insulin resistance and its potential benefits for weight loss and fertility. She is interested in exploring dietary options to manage her sweet cravings, which she identifies as a  challenge in her weight management efforts.      PHYSICAL EXAM:  Blood pressure 125/70, pulse 84, temperature 97.8 F (36.6 C), height 5\' 4"  (1.626 m), weight 210 lb (95.3 kg), SpO2 100%. Body mass index is 36.05 kg/m.  DIAGNOSTIC DATA REVIEWED:  BMET    Component Value Date/Time   NA 142 03/05/2023 1145   NA 140 12/03/2013 1939   K 4.6 03/05/2023 1145   K 3.9 12/03/2013 1939   CL 105 03/05/2023 1145   CL 112 (H) 12/03/2013 1939   CO2 22 03/05/2023 1145   CO2 20 (L) 12/03/2013 1939   GLUCOSE 88 03/05/2023 1145   GLUCOSE 93 12/03/2013 1939   BUN 11 03/05/2023 1145   BUN 17 12/03/2013 1939   CREATININE 0.73 03/05/2023 1145   CREATININE 0.74 12/03/2013 1939   CALCIUM 9.3 03/05/2023 1145   CALCIUM 8.9 12/03/2013 1939   GFRNONAA >60 12/03/2013 1939   GFRAA >60 12/03/2013 1939   Lab Results  Component Value Date   HGBA1C 5.4 04/20/2023   Lab Results  Component Value Date   INSULIN 11.5 04/14/2023   Lab Results  Component Value Date   TSH 0.792 03/05/2023   CBC    Component Value Date/Time   WBC 6.0 03/05/2023 1145   WBC 17.1 (H) 08/02/2018 0559   RBC 4.66 03/05/2023 1145   RBC 3.66 (L) 08/02/2018 0559   HGB 14.1 03/05/2023 1145   HCT 42.7 03/05/2023 1145   PLT 195 08/02/2018 0559   PLT 220 05/09/2018 0949   MCV 92 03/05/2023 1145  MCV 93 12/03/2013 1939   MCH 30.3 03/05/2023 1145   MCH 29.5 08/02/2018 0559   MCHC 33.0 03/05/2023 1145   MCHC 32.2 08/02/2018 0559   RDW 12.0 03/05/2023 1145   RDW 12.2 12/03/2013 1939   Iron Studies No results found for: "IRON", "TIBC", "FERRITIN", "IRONPCTSAT" Lipid Panel     Component Value Date/Time   CHOL 167 04/20/2023 0905   TRIG 74 04/20/2023 0905   HDL 42 04/20/2023 0905   CHOLHDL 4.0 04/20/2023 0905   LDLCALC 111 (H) 04/20/2023 0905   Hepatic Function Panel     Component Value Date/Time   PROT 6.4 03/05/2023 1145   PROT 7.8 12/03/2013 1939   ALBUMIN 4.3 03/05/2023 1145   ALBUMIN 3.9 12/03/2013 1939    AST 12 03/05/2023 1145   AST 11 (L) 12/03/2013 1939   ALT 12 03/05/2023 1145   ALT 22 12/03/2013 1939   ALKPHOS 67 03/05/2023 1145   ALKPHOS 63 12/03/2013 1939   BILITOT 0.2 03/05/2023 1145   BILITOT 1.1 (H) 12/03/2013 1939      Component Value Date/Time   TSH 0.792 03/05/2023 1145   Nutritional Lab Results  Component Value Date   VD25OH 28.3 (L) 04/20/2023     Assessment and Plan Assessment & Plan Obesity She has gained fifteen pounds in the last three months. She is following a category two eating plan 80% of the time and is engaging in weightlifting twice a week. Emphasized the importance of adhering to the eating plan as a form of medicine to prevent the body from fighting against weight loss efforts. Provided dietary advice, emphasizing the importance of protein intake at every meal to prevent insulin fluctuations and support weight loss. - Continue category two eating plan and ensure full adherence by the end of each day. - Increase protein intake at every meal, various ideass to increase protein discussed today - Engage in weightlifting twice a week. - Consider using metformin to aid weight loss and improve insulin resistance.  Insulin Resistance Insulin resistance is contributing to her weight gain and is linked to her PCOS. Her glucose levels are good, but her insulin levels are elevated. Explained the mechanism of insulin resistance and its impact on her health, including its role in PCOS and fertility challenges. Discussed metformin as a treatment option, which can treat insulin resistance, aid in weight loss, and reduce the likelihood of developing diabetes. Metformin is safe, with potential side effects including queasiness and diarrhea in 20% of the population if taken on an empty stomach or with high-calorie/carbohydrate meals. - Start metformin, one pill a day after the first meal, to treat insulin resistance and aid in weight loss. - Monitor for side effects of  metformin, such as queasiness and diarrhea, especially if taken on an empty stomach or with high-calorie/carbohydrate meals. - Continue dietary management with a focus on protein intake.  Polycystic Ovary Syndrome (PCOS) PCOS is linked to her insulin resistance and affects her fertility. She recently stopped using an IUD and is planning to conceive. Explained that treating insulin resistance with metformin can help regulate cycles and improve fertility. Metformin has been shown to improve fertility by regulating insulin resistance, which is the underlying issue for PCOS. Once pregnant, continuation of metformin depends on the OBGYN's decision. - Start metformin to help regulate cycles and improve fertility. - Continue dietary management with a focus on protein intake. - Monitor menstrual cycles and fertility status.  General Health Maintenance She is actively working on lifestyle modifications to  improve her health, including diet and exercise. Provided additional dietary advice and recipes to support her weight loss and health goals. - Provide a copy of the category two eating plan. - Incorporate high-protein recipes and snacks into the diet. - Explore alternative meal options to prevent dietary monotony.     I have personally spent 46 minutes total time today in preparation, direct face to face patient care, and documentation for this visit, including the following: review of clinical lab tests; review of medical history, discussion on Disease states of insulin resistance, PCOS and obesity and how these tie together as well as the importance of nutrition to treat all these conditions.    She was informed of the importance of frequent follow up visits to maximize her success with intensive lifestyle modifications for her multiple health conditions.    Jasmine Mesi, MD

## 2024-02-10 ENCOUNTER — Other Ambulatory Visit (INDEPENDENT_AMBULATORY_CARE_PROVIDER_SITE_OTHER): Payer: Self-pay | Admitting: Family Medicine

## 2024-02-10 DIAGNOSIS — E88819 Insulin resistance, unspecified: Secondary | ICD-10-CM

## 2024-02-23 ENCOUNTER — Ambulatory Visit (INDEPENDENT_AMBULATORY_CARE_PROVIDER_SITE_OTHER): Admitting: Family Medicine

## 2024-03-01 ENCOUNTER — Ambulatory Visit (INDEPENDENT_AMBULATORY_CARE_PROVIDER_SITE_OTHER): Admitting: Family Medicine

## 2024-07-02 NOTE — Progress Notes (Unsigned)
   SUBJECTIVE: Discussed the use of AI scribe software for clinical note transcription with the patient, who gave verbal consent to proceed.  Chief Complaint: Obesity  Interim History: ***  Barbara [name] is here to discuss her progress with her obesity treatment plan. She is on the {HWW Weight Loss Plan:210964005} and states she {CHL AMB IS/IS NOT:210130109} following her eating plan approximately *** % of the time. She states she {CHL AMB IS/IS NOT:210130109} exercising *** minutes *** times per week.   OBJECTIVE: Visit Diagnoses: Problem List Items Addressed This Visit     Generalized obesity   Insulin  resistance - Primary   Vitamin D  deficiency   Other Visit Diagnoses       PCOS (polycystic ovarian syndrome)           No data recorded No data recorded No data recorded No data recorded   ASSESSMENT AND PLAN:  Diet: Barbara [name] {CHL AMB IS/IS NOT:210130109} currently in the action stage of change. As such, her goal is to {HWW Weight Loss Efforts:210964006}. She {HAS HAS WNU:81165} agreed to {HWW Weight Loss Plan:210964005}.  Exercise: Barbara [name] has been instructed {HWW Exercise:210964007} for weight loss and overall health benefits.   Behavior Modification:  We discussed the following Behavioral Modification Strategies today: {HWW Behavior Modification:210964008}. We discussed various medication options to help Barbara [name] with her weight loss efforts and we both agreed to ***.  No follow-ups on file.Barbara [name] She was informed of the importance of frequent follow up visits to maximize her success with intensive lifestyle modifications for her multiple health conditions.  Attestation Statements:   Reviewed by clinician on day of visit: allergies, medications, problem list, medical history, surgical history, family history, social history, and previous encounter notes.   Time spent on visit including pre-visit chart review and post-visit care and charting was *** minutes.    Barbara Hegg,  PA-C

## 2024-07-03 ENCOUNTER — Ambulatory Visit (INDEPENDENT_AMBULATORY_CARE_PROVIDER_SITE_OTHER): Admitting: Physician Assistant

## 2024-07-03 DIAGNOSIS — E559 Vitamin D deficiency, unspecified: Secondary | ICD-10-CM

## 2024-07-03 DIAGNOSIS — E282 Polycystic ovarian syndrome: Secondary | ICD-10-CM

## 2024-07-03 DIAGNOSIS — E88819 Insulin resistance, unspecified: Secondary | ICD-10-CM

## 2024-07-03 DIAGNOSIS — E669 Obesity, unspecified: Secondary | ICD-10-CM

## 2024-07-24 ENCOUNTER — Encounter (INDEPENDENT_AMBULATORY_CARE_PROVIDER_SITE_OTHER): Payer: Self-pay | Admitting: Physician Assistant

## 2024-07-24 ENCOUNTER — Ambulatory Visit (INDEPENDENT_AMBULATORY_CARE_PROVIDER_SITE_OTHER): Admitting: Physician Assistant

## 2024-07-24 VITALS — BP 127/76 | HR 87 | Temp 98.4°F | Ht 64.0 in | Wt 219.0 lb

## 2024-07-24 DIAGNOSIS — E669 Obesity, unspecified: Secondary | ICD-10-CM

## 2024-07-24 DIAGNOSIS — E282 Polycystic ovarian syndrome: Secondary | ICD-10-CM

## 2024-07-24 DIAGNOSIS — K5900 Constipation, unspecified: Secondary | ICD-10-CM

## 2024-07-24 DIAGNOSIS — E88819 Insulin resistance, unspecified: Secondary | ICD-10-CM

## 2024-07-24 DIAGNOSIS — E559 Vitamin D deficiency, unspecified: Secondary | ICD-10-CM

## 2024-07-24 DIAGNOSIS — Z6837 Body mass index (BMI) 37.0-37.9, adult: Secondary | ICD-10-CM

## 2024-07-24 NOTE — Progress Notes (Unsigned)
 Office: [phone]  /  Fax: [phone]  Weight Summary And Biometrics  Vitals Temp: 98.4 F (36.9 C) BP: 127/76 Pulse Rate: 87 SpO2: 100 %   Anthropometric Measurements Height: 5' 4 (1.626 m) Weight: 219 lb (99.3 kg) BMI (Calculated): 37.57 Weight at Last Visit: 210 lb Weight Lost Since Last Visit: 0 Weight Gained Since Last Visit: 9 lb Starting Weight: 214 lb Total Weight Loss (lbs): 0 lb (0 kg)   Body Composition  Body Fat %: 44.3 % Fat Mass (lbs): 97 lbs Muscle Mass (lbs): 115.8 lbs Total Body Water (lbs): 82.8 lbs Visceral Fat Rating : 10    No data recorded Today's Visit #: 5  Starting Date: 04/20/23   Subjective   Chief Complaint: Obesity  Barbara [name] is here to discuss her progress with her obesity treatment plan. She is re-establishing with [facility] and has not been following a nutrition or exercise plan.consistently.     Weight Progress Since Last Visit: Barbara [name] is a 33 year old female with obesity and prediabetes who presents for a follow-up on her obesity treatment plan.  She has gained nine pounds since her last visit in April 2025 and has not been adhering to a nutrition plan. She engages in weightlifting exercises at least twice a week, although not consistently.  She has a history of prediabetes and was previously on metformin , which caused significant bowel movements but was not intolerable. She is considering resuming metformin , especially in the context of potential future pregnancy, as it may aid in fertility.  Her current medications include Abilify  5 mg daily, Buspar 10 mg, Wellbutrin  300 mg once daily, and Adderall. She is not on birth control and not currently trying to conceive but is working towards conception. She recently consulted with her psychiatrist, who indicated that all her medications, except Adderall, would be safe if she were to become pregnant.  In terms of her social history, she is married and has a six-year-old  daughter. Her family struggles with consuming sweets and is working on Engineer, materials. 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 Since last office visit she has gained 9 pounds. She reports she was not been following a nutrition plan but is interested in resumption of a plan   Engages in occasional physical activity.  Reports fatigue .   Challenges affecting patient progress: multiple competing priorities, work schedule, strong hunger signals and/or impaired satiety / inhibitory control, having difficulty with meal prep and planning, having difficulty focusing on healthy eating, difficulty implementing reduced calorie nutrition plan, need for convenience or prepackaged foods, exposure to enticing environments and/or relationships, presence of obesogenic drugs, inadequate sleep , moderate to high levels of stress, slow metabolism for age, emotional eating, and recent lapse in weight management plan due to work, travel, illness or family circumstances.   Orexigenic Control: Denies problems with appetite and hunger signals.  Reports problems with satiety and satiation.  Reports problems with eating patterns and portion control.  Denies abnormal cravings. Reports feeling deprived or restricted.   Pharmacotherapy for weight management: She is currently taking {EMPharmaco:[id number]}.   Assessment and Plan   Treatment Plan For Obesity:  Recommended Dietary Goals  Luara is currently in the action stage of change. As such, her goal is to continue weight management plan. She has agreed to: follow the Category 2 plan - 1200 kcal per day  Behavioral Health and Counseling  We discussed the following behavioral modification strategies  today: increasing lean protein intake to established goals, decreasing simple carbohydrates , increasing vegetables, increasing fiber rich foods, avoiding skipping meals, increasing water intake , work on meal  planning and preparation, reading food labels , identifying sources and decreasing liquid calories, decreasing eating out or consumption of processed foods, and making healthy choices when eating convenient foods, practice mindfulness eating and understand the difference between hunger signals and cravings, work on managing stress, creating time for self-care and relaxation, avoiding temptations and identifying enticing environmental cues, planning for success, better snacking choices, continue to work on maintaining a reduced calorie state, getting the recommended amount of protein, incorporating whole foods, making healthy choices, staying well hydrated and practicing mindfulness when eating., increase protein intake, fibrous foods (25 grams per day for women, 30 grams for men) and water to improve satiety and decrease hunger signals. , and getting back on track after recent relapse.  Additional education and resources provided today: Hand outs for Cat. 2 nutrition  plan with grocery list  Recommended Physical Activity Goals  Joury has been advised to work up to 150 minutes of moderate intensity aerobic activity a week and strengthening exercises 2-3 times per week for cardiovascular health, weight loss maintenance and preservation of muscle mass.   She has agreed to :  Think about enjoyable ways to increase daily physical activity and overcoming barriers to exercise, Increase physical activity in their day and reduce sedentary time (increase NEAT)., and consider use of a pedaler while working at her desk  Pharmacotherapy  We discussed various medication options to help Sybella with her weight loss efforts and we both agreed to :  Resume Cat 2 nutrition plan  Associated Conditions Impacted by Obesity Treatment  1. Insulin  resistance  2. PCOS (polycystic ovarian syndrome) ***  3. Vitamin D  deficiency ***  4. Constipation, unspecified constipation type ***  5. Obesity, Beginning BMI  36.73 ***  Obesity with insulin  resistance and prediabetes Obesity with recent weight gain of nine pounds. Insulin  resistance and prediabetes with previous metformin  use. Currently on obesogenic medications including Abilify  and Buspar, but also on Wellbutrin  and Adderall. Inconsistent nutrition plan and weight lifting exercises. Discussion on muscle mass's role in metabolism. Consideration of metformin  restart, noting previous side effects of intense bowel movements and potential fertility benefits. Emphasis on protein intake and hydration for metabolism and weight management. - Order fasting metabolism test and labs including insulin  level and A1c. - Initiate nutrition plan starting with category two, aiming for 85 grams of protein per day. - Encourage use of pre-prepared meals from [employer] for convenience. - Advise on high-protein breakfast and consistent protein intake throughout the day. - Recommend 80-100 ounces of water daily. - Limit diet sodas to no more than two per day. - Discuss potential restart of metformin , considering previous side effects and potential fertility benefits.  Polycystic ovarian syndrome (PCOS) PCOS with discussion on metformin 's fertility benefits. Current use of birth control and not actively trying to conceive. Psychiatrist reviewed medications for pregnancy safety, with Adderall noted as a concern. - Discuss potential restart of metformin , considering fertility benefits in PCOS. - Review current medications with psychiatrist for safety in pregnancy.  Constipation Constipation previously exacerbated by high-protein diet. Importance of fiber intake to prevent constipation, especially with high-protein diets. - Encourage intake of fiber-rich foods such as apples with peanut butter. - Advise on consistent hydration to support bowel regularity.  Objective   Physical Exam:  Blood pressure 127/76, pulse 87, temperature 98.4 F (36.9  C), height 5' 4  (1.626 m), weight 219 lb (99.3 kg), SpO2 100%. Body mass index is 37.59 kg/m.  General: She is overweight, cooperative, alert, well developed, and in no acute distress. PSYCH: Has normal mood, affect and thought process.   HEENT: EOMI, sclerae are anicteric. Lungs: Normal breathing effort, no conversational dyspnea. Extremities: No edema.  Neurologic: No gross sensory or motor deficits. No tremors or fasciculations noted.    Diagnostic Data Reviewed:  BMET    Component Value Date/Time   NA 142 03/05/2023 1145   NA 140 12/03/2013 1939   K 4.6 03/05/2023 1145   K 3.9 12/03/2013 1939   CL 105 03/05/2023 1145   CL 112 (H) 12/03/2013 1939   CO2 22 03/05/2023 1145   CO2 20 (L) 12/03/2013 1939   GLUCOSE 88 03/05/2023 1145   GLUCOSE 93 12/03/2013 1939   BUN 11 03/05/2023 1145   BUN 17 12/03/2013 1939   CREATININE 0.73 03/05/2023 1145   CREATININE 0.74 12/03/2013 1939   CALCIUM 9.3 03/05/2023 1145   CALCIUM 8.9 12/03/2013 1939   GFRNONAA >60 12/03/2013 1939   GFRAA >60 12/03/2013 1939   Lab Results  Component Value Date   HGBA1C 5.4 04/20/2023   Lab Results  Component Value Date   INSULIN  11.5 04/14/2023   Lab Results  Component Value Date   TSH 0.792 03/05/2023   CBC    Component Value Date/Time   WBC 6.0 03/05/2023 1145   WBC 17.1 (H) 08/02/2018 0559   RBC 4.66 03/05/2023 1145   RBC 3.66 (L) 08/02/2018 0559   HGB 14.1 03/05/2023 1145   HCT 42.7 03/05/2023 1145   PLT 195 08/02/2018 0559   PLT 220 05/09/2018 0949   MCV 92 03/05/2023 1145   MCV 93 12/03/2013 1939   MCH 30.3 03/05/2023 1145   MCH 29.5 08/02/2018 0559   MCHC 33.0 03/05/2023 1145   MCHC 32.2 08/02/2018 0559   RDW 12.0 03/05/2023 1145   RDW 12.2 12/03/2013 1939   Iron Studies No results found for: IRON, TIBC, FERRITIN, IRONPCTSAT Lipid Panel     Component Value Date/Time   CHOL 167 04/20/2023 0905   TRIG 74 04/20/2023 0905   HDL 42 04/20/2023 0905   CHOLHDL 4.0 04/20/2023 0905    LDLCALC 111 (H) 04/20/2023 0905   Hepatic Function Panel     Component Value Date/Time   PROT 6.4 03/05/2023 1145   PROT 7.8 12/03/2013 1939   ALBUMIN 4.3 03/05/2023 1145   ALBUMIN 3.9 12/03/2013 1939   AST 12 03/05/2023 1145   AST 11 (L) 12/03/2013 1939   ALT 12 03/05/2023 1145   ALT 22 12/03/2013 1939   ALKPHOS 67 03/05/2023 1145   ALKPHOS 63 12/03/2013 1939   BILITOT 0.2 03/05/2023 1145   BILITOT 1.1 (H) 12/03/2013 1939      Component Value Date/Time   TSH 0.792 03/05/2023 1145   Nutritional Lab Results  Component Value Date   VD25OH 28.3 (L) 04/20/2023    Follow-Up   Return in about 4 weeks (around 08/21/2024) for Fasting Lab, Fasting IC- Advised to arrive at 11 am for 11:30 am appt for fastingIC.SABRA She was informed of the importance of frequent follow up visits to maximize her success with intensive lifestyle modifications for her multiple health conditions.  Attestation Statement   Reviewed by clinician on day of visit: allergies, medications, problem list, medical history, surgical history, family history, social history, and previous encounter notes.     Kavina Cantave,PA-C

## 2024-08-20 NOTE — Progress Notes (Unsigned)
 SUBJECTIVE: Discussed the use of AI scribe software for clinical note transcription with the patient, who gave verbal consent to proceed.  Chief Complaint: Obesity  Interim History: She is up 4 lbs since her last visit.  Bio impedence scale: Muscle mass +2.4 lbs Adipose mass + 2.2 lbs  Anderson is here to discuss her progress with her obesity treatment plan. She is on the Category 2 Plan and states she is following her eating plan approximately 90 % of the time. She states she is exercising 10 minutes 3 times per week.  Fasting IC today: 1814 calories  Previous   1483 calories   331 calories more since last IC.  Maxi L Kadrmas is a 33 year old female who presents for follow-up of her obesity treatment plan.  She has a history of insulin  resistance, vitamin D  deficiency, and fatigue. She is not currently trying to conceive but is working towards getting pregnant again. She previously did not tolerate metformin  but is considering resuming it if necessary, but would like to work on her nutrition and exercise first.   Her recent indirect calorimetry showed an improvement in resting energy expenditure from 1483 to 1814 calories.   She is following a category two nutrition plan approximately 90% of the time, she experienced a weight gain of four pounds, primarily due to an increase in muscle mass.  She exercises for about ten minutes three days per week, using a program called [employer] on her daughter's GameCube, which includes a variety of activities such as cardio and weights.   She feels satisfied with her nutrition plan but struggles with feeling hungry after dinner. She typically eats eggs for breakfast, sandwich meat for lunch, and an apple with cheese as a snack. She has been drinking Chobani protein drinks, which contain 20 grams of protein and 11 grams of sugar, and is exploring other protein options like Premier and Fairlife protein shakes as alternatives to make sure she is  getting adequate protein without as much sugar.   Her energy levels are generally good, but she experiences fatigue in the afternoon. She was previously on vitamin D  supplements and currently takes a prenatal vitamin. She recently reduced her Wellbutrin  dose from 300 mg to 150 mg per her psychiatrist.   Her sleep is described as okay, and her stress levels vary. She is mindful of her diet, particularly during the holiday season, and is considering strategies to manage her food intake during Thanksgiving. She is also exploring different fruits to incorporate into her diet, such as kiwi, while avoiding high-sugar options like dried cranberries.  Fasting labs obtained today.  The patient was informed we would discuss the lab results at the next visit unless there is a critical issue that needs to be addressed sooner. The patient agreed to keep the next visit at the agreed upon time to discuss these results.   OBJECTIVE: Visit Diagnoses: Problem List Items Addressed This Visit     Other fatigue   Relevant Orders   Vitamin B12   TSH   SOBOE (shortness of breath on exertion) - Primary   Generalized obesity   Insulin  resistance   Relevant Orders   CMP14+EGFR   Hemoglobin A1c   Insulin , random   Lipid Panel With LDL/HDL Ratio   Vitamin D  deficiency   Relevant Orders   VITAMIN D  25 Hydroxy (Vit-D Deficiency, Fractures)   Other Visit Diagnoses       BMI 38.0-38.9,adult Current BMI 38.4  SOBOE Lexany notes increasing shortness of breath with exercising and seems to be worsening over time with weight gain. She notes getting out of breath sooner with activity than she used to. This has not gotten worse recently. Iwalani denies shortness of breath at rest or orthopnea.  She underwent repeat IC today noted above which showed an increase in REE.  She will continue on a Category 2 nutrition plan which should promote healthy weight loss.   Lucille does not feel that she gets out of breath  more easily that she used to when she exercises. Ameliya's shortness of breath appears to be obesity related and exercise induced. She has agreed to work on weight loss and gradually increase exercise to treat her exercise induced shortness of breath. Will continue to monitor closely.  Obesity with insulin  resistance Obesity with insulin  resistance, managed with a category two nutrition plan. Resting energy expenditure improved to 1814 calories from 1483 calories. Weight gain of 4 pounds, primarily muscle mass. Exercise includes 10 minutes of activity three days per week. Considering resuming metformin  but currently managing without it. Labs to evaluate insulin  resistance management. - Continue category two nutrition plan. - Incorporate 100-calorie snacks to increase satisfaction and maintain weight loss. - Ordered labs: A1c, insulin  level, cholesterol, kidney and liver function, B12, vitamin D , and TSH. - Encouraged consistent exercise, including weight training and HIIT activities. - Discussed potential benefits of metformin  for insulin  resistance and conception, but currently managing without it.   Insulin  Resistance Last fasting insulin  was 11.5. A1c was 5.4. 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 Optimal level < 1.9. This is complex condition associated with genetics, ectopic fat and lifestyle factors. Insulin  resistance may result in weight gain, abnormal cravings (particularly for carbs) and fatigue. This may result in additional weight gain and lead to pre-diabetes and diabetes if untreated.   Lab Results  Component Value Date   HGBA1C 5.4 04/20/2023   Lab Results  Component Value Date   INSULIN  11.5 04/14/2023   Lab Results  Component Value Date   GLUCOSE 88 03/05/2023   GLUCOSE 93 12/03/2013    We reviewed treatment options which include losing 7 to 10% of body weight, increasing physical activity to a 150 minutes a  week of moderate intensity.She may also be a candidate for pharmacoprophylaxis with metformin  or incretin mimetic.  She would like to avoid additional medications for now as she is hopeful to become pregnant soon.   Plan:  Continue working on nutrition plan to decrease simple carbohydrates, increase lean proteins and exercise to promote weight loss, improve glycemic control and prevent progression to Type 2 diabetes.  Hold off resumption of metformin  for now.  Recheck A1c, insulin  and CMET , as well as lipids today.   Vitamin D  deficiency Previously managed with supplements. Currently taking prenatal vitamins which may include vitamin D . - Ordered vitamin D  level. Low vitamin D  levels can be associated with adiposity and may result in leptin resistance and weight gain. Also associated with fatigue.  Currently on vitamin D  supplementation without any adverse effects such as nausea, vomiting or muscle weakness.   Fatigue Reports fatigue in the afternoon. B12 level to be checked as part of lab work. Current nutrition plan is B12 rich, but absorption may be an issue. - Ordered B12 level along with TSH and vitamin D  levels with other labs today. . Vitals Temp: 97.7 F (36.5 C) BP: 119/79 Pulse Rate: 71  SpO2: 100 %   Anthropometric Measurements Height: 5' 4 (1.626 m) Weight: 223 lb (101.2 kg) BMI (Calculated): 38.26 Weight at Last Visit: 219 lb Weight Lost Since Last Visit: 0 Weight Gained Since Last Visit: 4 lb Starting Weight: 214 lb Total Weight Loss (lbs): 0 lb (0 kg)   Body Composition  Body Fat %: 44.4 % Fat Mass (lbs): 99.2 lbs Muscle Mass (lbs): 118.2 lbs Total Body Water (lbs): 85.2 lbs Visceral Fat Rating : 10   Other Clinical Data RMR: 1814 Fasting: Yes Labs: Yes Today's Visit #: 6 Starting Date: 04/20/23 Comments: IC DONE TODAY     ASSESSMENT AND PLAN:  Diet: Lashia is currently in the action stage of change. As such, her goal is to continue with  weight loss efforts. She has agreed to Category 2 Plan + 100 calories snacks and provided hand out for snacks.  Exercise: Genell has been instructed to work up to a goal of 150 minutes of combined cardio and strengthening exercise per week and to continue exercising as is for weight loss and overall health benefits.   Behavior Modification:  We discussed the following Behavioral Modification Strategies today: increasing lean protein intake, decreasing simple carbohydrates, increasing vegetables, increase H2O intake, increase high fiber foods, meal planning and cooking strategies, better snacking choices, holiday eating strategies, avoiding temptations, and planning for success. We discussed various medication options to help Luticia with her weight loss efforts and we both agreed to continue to work on nutritional and behavioral strategies to promote weight loss.  .  Return in about 4 weeks (around 09/18/2024).SABRA She was informed of the importance of frequent follow up visits to maximize her success with intensive lifestyle modifications for her multiple health conditions.  Attestation Statements:   Reviewed by clinician on day of visit: allergies, medications, problem list, medical history, surgical history, family history, social history, and previous encounter notes.   Time spent on visit including pre-visit chart review and post-visit care and charting was 45 minutes.    Taiden Raybourn, PA-C

## 2024-08-21 ENCOUNTER — Ambulatory Visit (INDEPENDENT_AMBULATORY_CARE_PROVIDER_SITE_OTHER): Payer: Self-pay | Admitting: Physician Assistant

## 2024-08-21 ENCOUNTER — Encounter (INDEPENDENT_AMBULATORY_CARE_PROVIDER_SITE_OTHER): Payer: Self-pay | Admitting: Physician Assistant

## 2024-08-21 VITALS — BP 119/79 | HR 71 | Temp 97.7°F | Ht 64.0 in | Wt 223.0 lb

## 2024-08-21 DIAGNOSIS — R0602 Shortness of breath: Secondary | ICD-10-CM | POA: Diagnosis not present

## 2024-08-21 DIAGNOSIS — E669 Obesity, unspecified: Secondary | ICD-10-CM

## 2024-08-21 DIAGNOSIS — R5383 Other fatigue: Secondary | ICD-10-CM | POA: Diagnosis not present

## 2024-08-21 DIAGNOSIS — E88819 Insulin resistance, unspecified: Secondary | ICD-10-CM

## 2024-08-21 DIAGNOSIS — Z6838 Body mass index (BMI) 38.0-38.9, adult: Secondary | ICD-10-CM

## 2024-08-21 DIAGNOSIS — E559 Vitamin D deficiency, unspecified: Secondary | ICD-10-CM

## 2024-08-22 ENCOUNTER — Ambulatory Visit (INDEPENDENT_AMBULATORY_CARE_PROVIDER_SITE_OTHER): Payer: Self-pay | Admitting: Physician Assistant

## 2024-08-22 ENCOUNTER — Other Ambulatory Visit (INDEPENDENT_AMBULATORY_CARE_PROVIDER_SITE_OTHER): Payer: Self-pay | Admitting: Physician Assistant

## 2024-08-22 DIAGNOSIS — E559 Vitamin D deficiency, unspecified: Secondary | ICD-10-CM

## 2024-08-22 MED ORDER — VITAMIN D (ERGOCALCIFEROL) 1.25 MG (50000 UNIT) PO CAPS: 50000.0000 [IU] | ORAL_CAPSULE | ORAL | 0 refills | Status: AC

## 2024-08-23 LAB — CMP14+EGFR
ALT: 33 IU/L — ABNORMAL HIGH (ref 0–32)
AST: 22 IU/L (ref 0–40)
Albumin: 4.2 g/dL (ref 3.9–4.9)
Alkaline Phosphatase: 68 IU/L (ref 41–116)
BUN/Creatinine Ratio: 18 (ref 9–23)
BUN: 14 mg/dL (ref 6–20)
Bilirubin Total: 0.5 mg/dL (ref 0.0–1.2)
CO2: 20 mmol/L (ref 20–29)
Calcium: 8.8 mg/dL (ref 8.7–10.2)
Chloride: 107 mmol/L — ABNORMAL HIGH (ref 96–106)
Creatinine, Ser: 0.79 mg/dL (ref 0.57–1.00)
Globulin, Total: 2.2 g/dL (ref 1.5–4.5)
Glucose: 76 mg/dL (ref 70–99)
Potassium: 4.7 mmol/L (ref 3.5–5.2)
Sodium: 137 mmol/L (ref 134–144)
Total Protein: 6.4 g/dL (ref 6.0–8.5)
eGFR: 102 mL/min/1.73 (ref >59)

## 2024-08-23 LAB — HEMOGLOBIN A1C
Est. average glucose Bld gHb Est-mCnc: 97 mg/dL
Hgb A1c MFr Bld: 5 % (ref 4.8–5.6)

## 2024-08-23 LAB — TSH: TSH: 1.92 u[IU]/mL (ref 0.450–4.500)

## 2024-08-23 LAB — LIPID PANEL WITH LDL/HDL RATIO
Cholesterol, Total: 195 mg/dL (ref 100–199)
HDL: 61 mg/dL (ref >39)
LDL Chol Calc (NIH): 120 mg/dL — ABNORMAL HIGH (ref 0–99)
LDL/HDL Ratio: 2 ratio (ref 0.0–3.2)
Triglycerides: 76 mg/dL (ref 0–149)
VLDL Cholesterol Cal: 14 mg/dL (ref 5–40)

## 2024-08-23 LAB — VITAMIN B12: Vitamin B-12: 369 pg/mL (ref 232–1245)

## 2024-08-23 LAB — INSULIN, RANDOM: INSULIN: 4.1 u[IU]/mL (ref 2.6–24.9)

## 2024-08-23 LAB — VITAMIN D 25 HYDROXY (VIT D DEFICIENCY, FRACTURES): Vit D, 25-Hydroxy: 20.6 ng/mL — ABNORMAL LOW (ref 30.0–100.0)

## 2024-09-11 ENCOUNTER — Ambulatory Visit (INDEPENDENT_AMBULATORY_CARE_PROVIDER_SITE_OTHER): Admitting: Physician Assistant

## 2024-09-20 ENCOUNTER — Telehealth: Payer: Self-pay

## 2024-09-20 NOTE — Telephone Encounter (Signed)
 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

## 2024-10-16 ENCOUNTER — Ambulatory Visit (INDEPENDENT_AMBULATORY_CARE_PROVIDER_SITE_OTHER): Admitting: Physician Assistant

## 2024-10-23 ENCOUNTER — Ambulatory Visit (INDEPENDENT_AMBULATORY_CARE_PROVIDER_SITE_OTHER): Admitting: Physician Assistant

## 2024-10-30 ENCOUNTER — Ambulatory Visit: Admitting: Obstetrics & Gynecology

## 2024-11-20 ENCOUNTER — Ambulatory Visit (INDEPENDENT_AMBULATORY_CARE_PROVIDER_SITE_OTHER): Admitting: Physician Assistant
